# Patient Record
Sex: Female | Born: 1985 | Hispanic: Refuse to answer | Marital: Married | Smoking: Never smoker
Health system: Southern US, Community
[De-identification: ages and names within clinical notes are randomized; demographics above are authoritative.]

## PROBLEM LIST (undated history)

## (undated) HISTORY — PX: TONSILECTOMY, ADENOIDECTOMY, BILATERAL MYRINGOTOMY AND TUBES: SHX2538

---

## 2020-04-15 ENCOUNTER — Ambulatory Visit (INDEPENDENT_AMBULATORY_CARE_PROVIDER_SITE_OTHER): Payer: BC Managed Care – PPO | Admitting: Adult Health

## 2020-04-15 ENCOUNTER — Other Ambulatory Visit: Payer: Self-pay

## 2020-04-15 ENCOUNTER — Encounter: Payer: Self-pay | Admitting: Adult Health

## 2020-04-15 VITALS — BP 119/88 | HR 73 | Temp 98.1°F | Resp 16 | Ht 62.5 in | Wt 147.2 lb

## 2020-04-15 DIAGNOSIS — Z0000 Encounter for general adult medical examination without abnormal findings: Secondary | ICD-10-CM | POA: Diagnosis not present

## 2020-04-15 DIAGNOSIS — Z6826 Body mass index (BMI) 26.0-26.9, adult: Secondary | ICD-10-CM | POA: Insufficient documentation

## 2020-04-15 DIAGNOSIS — R82998 Other abnormal findings in urine: Secondary | ICD-10-CM

## 2020-04-15 DIAGNOSIS — E785 Hyperlipidemia, unspecified: Secondary | ICD-10-CM | POA: Insufficient documentation

## 2020-04-15 DIAGNOSIS — Z1389 Encounter for screening for other disorder: Secondary | ICD-10-CM

## 2020-04-15 DIAGNOSIS — E789 Disorder of lipoprotein metabolism, unspecified: Secondary | ICD-10-CM | POA: Insufficient documentation

## 2020-04-15 DIAGNOSIS — R7989 Other specified abnormal findings of blood chemistry: Secondary | ICD-10-CM | POA: Insufficient documentation

## 2020-04-15 LAB — POCT URINALYSIS DIPSTICK
Bilirubin, UA: NEGATIVE
Glucose, UA: NEGATIVE
Ketones, UA: NEGATIVE
Nitrite, UA: NEGATIVE
Protein, UA: NEGATIVE
Spec Grav, UA: 1.01 (ref 1.010–1.025)
Urobilinogen, UA: 0.2 E.U./dL
pH, UA: 7 (ref 5.0–8.0)

## 2020-04-15 NOTE — Progress Notes (Signed)
New patient visit   Patient: Jo Gibson   DOB: 1985-11-07   35 y.o. Female  MRN: [id number] Visit Date: 04/15/2020  Today's healthcare provider: Jairo Ben, FNP   Chief Complaint  Patient presents with  . New Patient (Initial Visit)   Subjective    Jo Gibson is a 35 y.o. female who presents today as a new patient to establish care.  HPI  Patient reports that she feels way today and has no concerns to address. Patient states that she follows a well balanced diet and states that she is actively exercising 7x a week by walking. Patient reports sleep patterns are well.   She worked hard to get off 15 lbs and lost it with diet and exercise.  She wants to do blood work today.   She had borderline high cholesterol.   Patient  denies any fever, body aches,chills, rash, chest pain, shortness of breath, nausea, vomiting, or diarrhea.  Denies dizziness, lightheadedness, pre syncopal or syncopal episodes.    Mom did genetic testing and was negative for Braca.   She has last PAP was when she had her daughter 2 years ago in June, normal PAP, no abnormal PAP's since her 35 year old was born.  No history of gynecology procedures.   [id number].  Vaginal deliveries.   Patient  denies any fever, body aches,chills, rash, chest pain, shortness of breath, nausea, vomiting, or diarrhea.  Denies dizziness, lightheadedness, pre syncopal or syncopal episodes.    Patient's last menstrual period was 04/15/2020 (exact date).  History reviewed. No pertinent past medical history. History reviewed. No pertinent surgical history. Family Status  Relation Name Status  . Mother  (Not Specified)  . Father  (Not Specified)  . Mat Aunt  (Not Specified)  . MGM  (Not Specified)  . PGM  (Not Specified)  . PGF  (Not Specified)   Family History  Problem Relation Age of Onset  . Cancer Mother   . Epilepsy Mother   . Hypertension Father   . Thyroid disease Maternal Aunt   .  Hypertension Maternal Grandmother   . Liver cancer Paternal Grandmother   . Diabetes Paternal Grandfather        type 2   Social History   Socioeconomic History  . Marital status: Married    Spouse name: Not on file  . Number of children: Not on file  . Years of education: Not on file  . Highest education level: Not on file  Occupational History  . Not on file  Tobacco Use  . Smoking status: Never Smoker  . Smokeless tobacco: Never Used  Substance and Sexual Activity  . Alcohol use: Not Currently  . Drug use: Never  . Sexual activity: Yes  Other Topics Concern  . Not on file  Social History Narrative  . Not on file   Social Determinants of Health   Financial Resource Strain: Not on file  Food Insecurity: Not on file  Transportation Needs: Not on file  Physical Activity: Not on file  Stress: Not on file  Social Connections: Not on file   Outpatient Medications Prior to Visit  Medication Sig  . ASHWAGANDHA PO Take by mouth.   No facility-administered medications prior to visit.   No Known Allergies   Family History  Problem Relation Age of Onset  . Cancer Mother   . Epilepsy Mother   . Hypertension Father   . Thyroid disease Maternal Aunt   . Hypertension Maternal Grandmother   .  Liver cancer Paternal Grandmother   . Diabetes Paternal Grandfather        type 2    There is no immunization history on file for this patient.  Health Maintenance  Topic Date Due  . COVID-19 Vaccine (1) 05/01/2020 (Originally 03/12/1990)  . INFLUENZA VACCINE  05/02/2020 (Originally 09/03/2019)  . TETANUS/TDAP  04/15/2021 (Originally 03/12/2004)  . Hepatitis C Screening  04/15/2021 (Originally Oct 06, 1985)  . HIV Screening  04/15/2021 (Originally 03/12/2000)  . PAP SMEAR-Modifier  05/24/2021  . HPV VACCINES  Aged Out    Patient Care Team: Saesha Llerenas, Eula Fried, FNP as PCP - General (Family Medicine)  Review of Systems  Musculoskeletal: Positive for back pain.  All other systems  reviewed and are negative.     Objective    BP 119/88   Pulse 73   Temp 98.1 F (36.7 C) (Oral)   Resp 16   Ht 5' 2.5" (1.588 m)   Wt 147 lb 3.2 oz (66.8 kg)   LMP 04/15/2020 (Exact Date)   SpO2 100%   BMI 26.49 kg/m  Physical Exam Vitals reviewed.  Constitutional:      General: She is not in acute distress.    Appearance: Normal appearance. She is well-developed and normal weight. She is not ill-appearing, toxic-appearing or diaphoretic.     Interventions: She is not intubated.    Comments: Patient is alert and oriented and responsive to questions Engages in eye contact with provider. Speaks in full sentences without any pauses without any shortness of breath or distress.    HENT:     Head: Normocephalic and atraumatic.     Right Ear: Tympanic membrane, ear canal and external ear normal. There is no impacted cerumen.     Left Ear: Tympanic membrane, ear canal and external ear normal. There is no impacted cerumen.     Nose: Nose normal. No congestion or rhinorrhea.     Mouth/Throat:     Pharynx: No oropharyngeal exudate or posterior oropharyngeal erythema.  Eyes:     General: Lids are normal. No scleral icterus.       Right eye: No discharge.        Left eye: No discharge.     Conjunctiva/sclera: Conjunctivae normal.     Right eye: Right conjunctiva is not injected. No exudate or hemorrhage.    Left eye: Left conjunctiva is not injected. No exudate or hemorrhage.    Pupils: Pupils are equal, round, and reactive to light.  Neck:     Thyroid: No thyroid mass or thyromegaly.     Vascular: Normal carotid pulses. No carotid bruit, hepatojugular reflux or JVD.     Trachea: Trachea and phonation normal. No tracheal tenderness or tracheal deviation.     Meningeal: Brudzinski's sign and Kernig's sign absent.  Cardiovascular:     Rate and Rhythm: Normal rate and regular rhythm.     Pulses: Normal pulses.          Radial pulses are 2+ on the right side and 2+ on the left side.        Dorsalis pedis pulses are 2+ on the right side and 2+ on the left side.       Posterior tibial pulses are 2+ on the right side and 2+ on the left side.     Heart sounds: Normal heart sounds, S1 normal and S2 normal. Heart sounds not distant. No murmur heard. No friction rub. No gallop.   Pulmonary:     Effort: Pulmonary effort  is normal. No tachypnea, bradypnea, accessory muscle usage or respiratory distress. She is not intubated.     Breath sounds: Normal breath sounds. No stridor. No wheezing, rhonchi or rales.  Chest:     Chest wall: No tenderness.  Breasts:     Right: No supraclavicular adenopathy.     Left: No supraclavicular adenopathy.    Abdominal:     General: Bowel sounds are normal. There is no distension or abdominal bruit.     Palpations: Abdomen is soft. There is no shifting dullness, fluid wave, hepatomegaly, splenomegaly, mass or pulsatile mass.     Tenderness: There is no abdominal tenderness. There is no right CVA tenderness, left CVA tenderness, guarding or rebound.     Hernia: No hernia is present.  Musculoskeletal:        General: No tenderness or deformity. Normal range of motion.     Cervical back: Full passive range of motion without pain, normal range of motion and neck supple. No edema, erythema, rigidity or tenderness. No spinous process tenderness or muscular tenderness. Normal range of motion.     Right lower leg: No edema.     Left lower leg: No edema.  Lymphadenopathy:     Head:     Right side of head: No submental, submandibular, tonsillar, preauricular, posterior auricular or occipital adenopathy.     Left side of head: No submental, submandibular, tonsillar, preauricular, posterior auricular or occipital adenopathy.     Cervical: No cervical adenopathy.     Right cervical: No superficial, deep or posterior cervical adenopathy.    Left cervical: No superficial, deep or posterior cervical adenopathy.     Upper Body:     Right upper body: No  supraclavicular or pectoral adenopathy.     Left upper body: No supraclavicular or pectoral adenopathy.  Skin:    General: Skin is warm and dry.     Coloration: Skin is not jaundiced or pale.     Findings: No abrasion, bruising, burn, ecchymosis, erythema, lesion, petechiae or rash.     Nails: There is no clubbing.  Neurological:     Mental Status: She is alert and oriented to person, place, and time. Mental status is at baseline.     GCS: GCS eye subscore is 4. GCS verbal subscore is 5. GCS motor subscore is 6.     Cranial Nerves: No cranial nerve deficit.     Sensory: No sensory deficit.     Motor: No weakness, tremor, atrophy, abnormal muscle tone or seizure activity.     Coordination: Coordination normal.     Gait: Gait normal.     Deep Tendon Reflexes: Reflexes are normal and symmetric. Reflexes normal. Babinski sign absent on the right side. Babinski sign absent on the left side.     Reflex Scores:      Tricep reflexes are 2+ on the right side and 2+ on the left side.      Bicep reflexes are 2+ on the right side and 2+ on the left side.      Brachioradialis reflexes are 2+ on the right side and 2+ on the left side.      Patellar reflexes are 2+ on the right side and 2+ on the left side.      Achilles reflexes are 2+ on the right side and 2+ on the left side. Psychiatric:        Mood and Affect: Mood normal.        Speech: Speech normal.  Behavior: Behavior normal.        Thought Content: Thought content normal.        Judgment: Judgment normal.     Depression Screen PHQ 2/9 Scores 04/15/2020  PHQ - 2 Score 0  PHQ- 9 Score 0   Results for orders placed or performed in visit on 04/15/20  POCT urinalysis dipstick  Result Value Ref Range   Color, UA dark yellow    Clarity, UA clear    Glucose, UA Negative Negative   Bilirubin, UA negative    Ketones, UA negative    Spec Grav, UA 1.010 1.010 - 1.025   Blood, UA non hemolyzed moderate    pH, UA 7.0 5.0 - 8.0    Protein, UA Negative Negative   Urobilinogen, UA 0.2 0.2 or 1.0 E.U./dL   Nitrite, UA negative    Leukocytes, UA Small (1+) (A) Negative   Appearance     Odor      Assessment & Plan       1. Routine health maintenance Screening labs.  The patient is advised to follow a low fat, low cholesterol diet, reduce salt in diet and cooking, reduce exposure to stress, continue current medications, continue current healthy lifestyle patterns and return for routine annual checkups.  - CBC with Differential/Platelet - Comprehensive metabolic panel  2. Screening for blood or protein in urine  - POCT urinalysis dipstick  3. Low serum vitamin D History of taking vitamin D supplement. Will check levels.  - VITAMIN D 25 Hydroxy (Vit-D Deficiency, Fractures)  4. Leukocytes in urine Will send for :  - Urine Culture   5. Borderline high cholesterol Will check.  - Lipid panel - TSH  7. Elevated cortisol level Will check.  - Cortisol  8. Body mass index 26.0-26.9, adult Discussed BMI.   Return for PAP smear when due.  Red Flags discussed. The patient was given clear instructions to go to ER or return to medical center if any red flags develop, symptoms do not improve, worsen or new problems develop. They verbalized understanding.   Return if symptoms worsen or fail to improve, for at any time for any worsening symptoms, Go to Emergency room/ urgent care if worse.    The entirety of the information documented in the History of Present Illness, Review of Systems and Physical Exam were personally obtained by me. Portions of this information were initially documented by the CMA and reviewed by me for thoroughness and accuracy.    Jairo BenMichelle Smith Japheth Diekman, FNP  [facility] [phone] (phone) [phone] (fax)  [facility]

## 2020-04-15 NOTE — Patient Instructions (Signed)
Health Maintenance, Female Adopting a healthy lifestyle and getting preventive care are important in promoting health and wellness. Ask your health care provider about:  The right schedule for you to have regular tests and exams.  Things you can do on your own to prevent diseases and keep yourself healthy. What should I know about diet, weight, and exercise? Eat a healthy diet  Eat a diet that includes plenty of vegetables, fruits, low-fat dairy products, and lean protein.  Do not eat a lot of foods that are high in solid fats, added sugars, or sodium.   Maintain a healthy weight Body mass index (BMI) is used to identify weight problems. It estimates body fat based on height and weight. Your health care provider can help determine your BMI and help you achieve or maintain a healthy weight. Get regular exercise Get regular exercise. This is one of the most important things you can do for your health. Most adults should:  Exercise for at least 150 minutes each week. The exercise should increase your heart rate and make you sweat (moderate-intensity exercise).  Do strengthening exercises at least twice a week. This is in addition to the moderate-intensity exercise.  Spend less time sitting. Even light physical activity can be beneficial. Watch cholesterol and blood lipids Have your blood tested for lipids and cholesterol at 35 years of age, then have this test every 5 years. Have your cholesterol levels checked more often if:  Your lipid or cholesterol levels are high.  You are older than 35 years of age.  You are at high risk for heart disease. What should I know about cancer screening? Depending on your health history and family history, you may need to have cancer screening at various ages. This may include screening for:  Breast cancer.  Cervical cancer.  Colorectal cancer.  Skin cancer.  Lung cancer. What should I know about heart disease, diabetes, and high blood  pressure? Blood pressure and heart disease  High blood pressure causes heart disease and increases the risk of stroke. This is more likely to develop in people who have high blood pressure readings, are of African descent, or are overweight.  Have your blood pressure checked: ? Every 3-5 years if you are 18-39 years of age. ? Every year if you are 40 years old or older. Diabetes Have regular diabetes screenings. This checks your fasting blood sugar level. Have the screening done:  Once every three years after age 40 if you are at a normal weight and have a low risk for diabetes.  More often and at a younger age if you are overweight or have a high risk for diabetes. What should I know about preventing infection? Hepatitis B If you have a higher risk for hepatitis B, you should be screened for this virus. Talk with your health care provider to find out if you are at risk for hepatitis B infection. Hepatitis C Testing is recommended for:  Everyone born from 1945 through 1965.  Anyone with known risk factors for hepatitis C. Sexually transmitted infections (STIs)  Get screened for STIs, including gonorrhea and chlamydia, if: ? You are sexually active and are younger than 35 years of age. ? You are older than 35 years of age and your health care provider tells you that you are at risk for this type of infection. ? Your sexual activity has changed since you were last screened, and you are at increased risk for chlamydia or gonorrhea. Ask your health care provider   if you are at risk.  Ask your health care provider about whether you are at high risk for HIV. Your health care provider may recommend a prescription medicine to help prevent HIV infection. If you choose to take medicine to prevent HIV, you should first get tested for HIV. You should then be tested every 3 months for as long as you are taking the medicine. Pregnancy  If you are about to stop having your period (premenopausal) and  you may become pregnant, seek counseling before you get pregnant.  Take 400 to 800 micrograms (mcg) of folic acid every day if you become pregnant.  Ask for birth control (contraception) if you want to prevent pregnancy. Osteoporosis and menopause Osteoporosis is a disease in which the bones lose minerals and strength with aging. This can result in bone fractures. If you are 65 years old or older, or if you are at risk for osteoporosis and fractures, ask your health care provider if you should:  Be screened for bone loss.  Take a calcium or vitamin D supplement to lower your risk of fractures.  Be given hormone replacement therapy (HRT) to treat symptoms of menopause. Follow these instructions at home: Lifestyle  Do not use any products that contain nicotine or tobacco, such as cigarettes, e-cigarettes, and chewing tobacco. If you need help quitting, ask your health care provider.  Do not use street drugs.  Do not share needles.  Ask your health care provider for help if you need support or information about quitting drugs. Alcohol use  Do not drink alcohol if: ? Your health care provider tells you not to drink. ? You are pregnant, may be pregnant, or are planning to become pregnant.  If you drink alcohol: ? Limit how much you use to 0-1 drink a day. ? Limit intake if you are breastfeeding.  Be aware of how much alcohol is in your drink. In the [place] one drink equals one 12 oz bottle of beer (355 mL), one 5 oz glass of wine (148 mL), or one 1 oz glass of hard liquor (44 mL). General instructions  Schedule regular health, dental, and eye exams.  Stay current with your vaccines.  Tell your health care provider if: ? You often feel depressed. ? You have ever been abused or do not feel safe at home. Summary  Adopting a healthy lifestyle and getting preventive care are important in promoting health and wellness.  Follow your health care provider's instructions about healthy  diet, exercising, and getting tested or screened for diseases.  Follow your health care provider's instructions on monitoring your cholesterol and blood pressure. This information is not intended to replace advice given to you by your health care provider. Make sure you discuss any questions you have with your health care provider. Document Revised: 01/12/2018 Document Reviewed: 01/12/2018 Elsevier Patient Education  2021 [employer]  

## 2020-04-16 ENCOUNTER — Encounter: Payer: Self-pay | Admitting: Adult Health

## 2020-04-16 LAB — COMPREHENSIVE METABOLIC PANEL
ALT: 6 IU/L (ref 0–32)
AST: 12 IU/L (ref 0–40)
Albumin/Globulin Ratio: 1.9 (ref 1.2–2.2)
Albumin: 4.4 g/dL (ref 3.8–4.8)
Alkaline Phosphatase: 46 IU/L (ref 44–121)
BUN/Creatinine Ratio: 17 (ref 9–23)
BUN: 12 mg/dL (ref 6–20)
Bilirubin Total: 0.6 mg/dL (ref 0.0–1.2)
CO2: 24 mmol/L (ref 20–29)
Calcium: 9.5 mg/dL (ref 8.7–10.2)
Chloride: 101 mmol/L (ref 96–106)
Creatinine, Ser: 0.69 mg/dL (ref 0.57–1.00)
Globulin, Total: 2.3 g/dL (ref 1.5–4.5)
Glucose: 88 mg/dL (ref 65–99)
Potassium: 4.5 mmol/L (ref 3.5–5.2)
Sodium: 140 mmol/L (ref 134–144)
Total Protein: 6.7 g/dL (ref 6.0–8.5)
eGFR: 116 mL/min/{1.73_m2} (ref >59)

## 2020-04-16 LAB — CBC WITH DIFFERENTIAL/PLATELET
Basophils Absolute: 0 10*3/uL (ref 0.0–0.2)
Basos: 1 %
EOS (ABSOLUTE): 0.2 10*3/uL (ref 0.0–0.4)
Eos: 2 %
Hematocrit: 39.4 % (ref 34.0–46.6)
Hemoglobin: 13 g/dL (ref 11.1–15.9)
Immature Grans (Abs): 0 10*3/uL (ref 0.0–0.1)
Immature Granulocytes: 0 %
Lymphocytes Absolute: 2.1 10*3/uL (ref 0.7–3.1)
Lymphs: 32 %
MCH: 28.1 pg (ref 26.6–33.0)
MCHC: 33 g/dL (ref 31.5–35.7)
MCV: 85 fL (ref 79–97)
Monocytes Absolute: 0.5 10*3/uL (ref 0.1–0.9)
Monocytes: 7 %
Neutrophils Absolute: 3.9 10*3/uL (ref 1.4–7.0)
Neutrophils: 58 %
Platelets: 277 10*3/uL (ref 150–450)
RBC: 4.63 x10E6/uL (ref 3.77–5.28)
RDW: 13.2 % (ref 11.7–15.4)
WBC: 6.7 10*3/uL (ref 3.4–10.8)

## 2020-04-16 LAB — LIPID PANEL
Chol/HDL Ratio: 4.6 ratio — ABNORMAL HIGH (ref 0.0–4.4)
Cholesterol, Total: 193 mg/dL (ref 100–199)
HDL: 42 mg/dL (ref >39)
LDL Chol Calc (NIH): 134 mg/dL — ABNORMAL HIGH (ref 0–99)
Triglycerides: 91 mg/dL (ref 0–149)
VLDL Cholesterol Cal: 17 mg/dL (ref 5–40)

## 2020-04-16 LAB — TSH: TSH: 0.713 u[IU]/mL (ref 0.450–4.500)

## 2020-04-16 LAB — CORTISOL: Cortisol: 8.3 ug/dL

## 2020-04-16 LAB — VITAMIN D 25 HYDROXY (VIT D DEFICIENCY, FRACTURES): Vit D, 25-Hydroxy: 36.3 ng/mL (ref 30.0–100.0)

## 2020-04-16 NOTE — Progress Notes (Signed)
CBC is within normal limits no signs of infection or  anemia.  CMP is within normal limits kidney/ liver function and electrolytes.   LDL ( bad cholesterol) elevated.  Discuss lifestyle modification with patient e.g. increase exercise, fiber, fruits, vegetables, lean meat, and omega 3/fish intake and decrease saturated fat.  If patient following strict diet and exercise program already please schedule follow up appointment with primary care physician.   Were you fasting for 8 hours before cholesterol check if not this can affect lab results ? Cholesterol total is improved from last year and LDL is around the same.   Vitamin D is within normal limits would advise taking Vitamin D at 4,000 international units by mouth daily - recheck in  vitamin D lab in 4- 6 months as well as lipid panel.   TSH for thyroid is within normal limits. Cortisol level is within normal limits.

## 2020-04-17 LAB — URINE CULTURE: Organism ID, Bacteria: NO GROWTH

## 2020-04-17 NOTE — Progress Notes (Signed)
No growth on urine culture.

## 2020-05-06 ENCOUNTER — Ambulatory Visit: Payer: Self-pay | Admitting: Adult Health

## 2020-05-07 ENCOUNTER — Ambulatory Visit: Payer: Self-pay | Admitting: Adult Health

## 2020-05-09 ENCOUNTER — Ambulatory Visit: Payer: Self-pay | Admitting: Adult Health

## 2020-05-28 ENCOUNTER — Ambulatory Visit (INDEPENDENT_AMBULATORY_CARE_PROVIDER_SITE_OTHER): Payer: BLUE CROSS/BLUE SHIELD | Admitting: Adult Health

## 2020-05-28 ENCOUNTER — Encounter: Payer: Self-pay | Admitting: Adult Health

## 2020-05-28 ENCOUNTER — Other Ambulatory Visit (HOSPITAL_COMMUNITY)
Admission: RE | Admit: 2020-05-28 | Discharge: 2020-05-28 | Disposition: A | Discharge status: Home / Self Care | Payer: BLUE CROSS/BLUE SHIELD | Source: Ambulatory Visit | Attending: Adult Health | Admitting: Adult Health

## 2020-05-28 ENCOUNTER — Other Ambulatory Visit: Payer: Self-pay

## 2020-05-28 VITALS — BP 106/78 | HR 65 | Temp 97.6°F | Ht 62.52 in | Wt 142.6 lb

## 2020-05-28 DIAGNOSIS — E663 Overweight: Secondary | ICD-10-CM

## 2020-05-28 DIAGNOSIS — Z01419 Encounter for gynecological examination (general) (routine) without abnormal findings: Secondary | ICD-10-CM

## 2020-05-28 DIAGNOSIS — E785 Hyperlipidemia, unspecified: Secondary | ICD-10-CM

## 2020-05-28 NOTE — Patient Instructions (Addendum)
Pap Test Why am I having this test? A Pap test, also called a Pap smear, is a screening test to check for signs of:  Cancer of the vagina, cervix, and uterus. The cervix is the lower part of the uterus that opens into the vagina.  Infection.  Changes that may be a sign that cancer is developing (precancerous changes). Women need this test on a regular basis. In general, you should have a Pap test every 3 years until you reach menopause or age 35. Women aged 30-60 may choose to have their Pap test done at the same time as an HPV (human papillomavirus) test every 5 years (instead of every 3 years). Your health care provider may recommend having Pap tests more or less often depending on your medical conditions and past Pap test results. What kind of sample is taken? Your health care provider will collect a sample of cells from the surface of your cervix. This will be done using a small cotton swab, plastic spatula, or brush. This sample is often collected during a pelvic exam, when you are lying on your back on an exam table with feet in footrests (stirrups). In some cases, fluids (secretions) from the cervix or vagina may also be collected.   How do I prepare for this test?  Be aware of where you are in your menstrual cycle. If you are menstruating on the day of the test, you may be asked to reschedule.  You may need to reschedule if you have a known vaginal infection on the day of the test.  Follow instructions from your health care provider about: ? Changing or stopping your regular medicines. Some medicines can cause abnormal test results, such as digitalis and tetracycline. ? Avoiding douching or taking a bath the day before or the day of the test. Tell a health care provider about:  Any allergies you have.  All medicines you are taking, including vitamins, herbs, eye drops, creams, and over-the-counter medicines.  Any blood disorders you have.  Any surgeries you have had.  Any  medical conditions you have.  Whether you are pregnant or may be pregnant. How are the results reported? Your test results will be reported as either abnormal or normal. A false-positive result can occur. A false positive is incorrect because it means that a condition is present when it is not. A false-negative result can occur. A false negative is incorrect because it means that a condition is not present when it is. What do the results mean? A normal test result means that you do not have signs of cancer of the vagina, cervix, or uterus. An abnormal result may mean that you have:  Cancer. A Pap test by itself is not enough to diagnose cancer. You will have more tests done in this case.  Precancerous changes in your vagina, cervix, or uterus.  Inflammation of the cervix.  An STD (sexually transmitted disease).  A fungal infection.  A parasite infection. Talk with your health care provider about what your results mean. Questions to ask your health care provider Ask your health care provider, or the department that is doing the test:  When will my results be ready?  How will I get my results?  What are my treatment options?  What other tests do I need?  What are my next steps? Summary  In general, women should have a Pap test every 3 years until they reach menopause or age 29.  Your health care provider will collect  collect a sample of cells from the surface of your cervix. This will be done using a small cotton swab, plastic spatula, or brush.  In some cases, fluids (secretions) from the cervix or vagina may also be collected. This information is not intended to replace advice given to you by your health care provider. Make sure you discuss any questions you have with your health care provider. Document Revised: 09/27/2019 Document Reviewed: 09/22/2019 Elsevier Patient Education  2021 Elsevier Inc.  Breast Self-Awareness Breast self-awareness is knowing how your breasts look  and feel. Doing breast self-awareness is important. It allows you to catch a breast problem early while it is still small and can be treated. All women should do breast self-awareness, including women who have had breast implants. Tell your doctor if you notice a change in your breasts. What you need:  A mirror.  A well-lit room. How to do a breast self-exam A breast self-exam is one way to learn what is normal for your breasts and to check for changes. To do a breast self-exam: Look for changes 1. Take off all the clothes above your waist. 2. Stand in front of a mirror in a room with good lighting. 3. Put your hands on your hips. 4. Push your hands down. 5. Look at your breasts and nipples in the mirror to see if one breast or nipple looks different from the other. Check to see if: ? The shape of one breast is different. ? The size of one breast is different. ? There are wrinkles, dips, and bumps in one breast and not the other. 6. Look at each breast for changes in the skin, such as: ? Redness. ? Scaly areas. 7. Look for changes in your nipples, such as: ? Liquid around the nipples. ? Bleeding. ? Dimpling. ? Redness. ? A change in where the nipples are.   Feel for changes 1. Lie on your back on the floor. 2. Feel each breast. To do this, follow these steps: ? Pick a breast to feel. ? Put the arm closest to that breast above your head. ? Use your other arm to feel the nipple area of your breast. Feel the area with the pads of your three middle fingers by making small circles with your fingers. For the first circle, press lightly. For the second circle, press harder. For the third circle, press even harder. ? Keep making circles with your fingers at the different pressures as you move down your breast. Stop when you feel your ribs. ? Move your fingers a little toward the center of your body. ? Start making circles with your fingers again, this time going up until you reach your  collarbone. ? Keep making up-and-down circles until you reach your armpit. Remember to keep using the three pressures. ? Feel the other breast in the same way. 3. Sit or stand in the tub or shower. 4. With soapy water on your skin, feel each breast the same way you did in step 2 when you were lying on the floor.   Write down what you find Writing down what you find can help you remember what to tell your doctor. Write down:  What is normal for each breast.  Any changes you find in each breast, including: ? The kind of changes you find. ? Whether you have pain. ? Size and location of any lumps.  When you last had your menstrual period. General tips  Check your breasts every month.  If you   are breastfeeding, the best time to check your breasts is after you feed your baby or after you use a breast pump.  If you get menstrual periods, the best time to check your breasts is 5-7 days after your menstrual period is over.  With time, you will become comfortable with the self-exam, and you will begin to know if there are changes in your breasts. Contact a doctor if you:  See a change in the shape or size of your breasts or nipples.  See a change in the skin of your breast or nipples, such as red or scaly skin.  Have fluid coming from your nipples that is not normal.  Find a lump or thick area that was not there before.  Have pain in your breasts.  Have any concerns about your breast health. Summary  Breast self-awareness includes looking for changes in your breasts, as well as feeling for changes within your breasts.  Breast self-awareness should be done in front of a mirror in a well-lit room.  You should check your breasts every month. If you get menstrual periods, the best time to check your breasts is 5-7 days after your menstrual period is over.  Let your doctor know of any changes you see in your breasts, including changes in size, changes on the skin, pain or tenderness,  or fluid from your nipples that is not normal. This information is not intended to replace advice given to you by your health care provider. Make sure you discuss any questions you have with your health care provider. Document Revised: 09/07/2017 Document Reviewed: 09/07/2017 Elsevier Patient Education  2021 Elsevier Inc.  

## 2020-05-28 NOTE — Progress Notes (Signed)
Established Patient Office Visit  Subjective:  Patient ID: Jo Gibson, female    DOB: 1985/12/02  Age: 35 y.o. MRN: [id number]  CC:  Chief Complaint  Patient presents with  . Follow-up    Pap smear and breast exam    HPI Jo Gibson presents for pap smear and follow up on hyperlipidemia. She has no concerns today. Her mother did have breast cancer in her 74's and she did test negative for BRACA gene.    Cholesterol was elevated however patient was not fasting. She will have this rechecked fasting.   Patient  denies any fever, body aches,chills, rash, chest pain, shortness of breath, nausea, vomiting, or diarrhea.  Denies dizziness, lightheadedness, pre syncopal or syncopal episodes.     History reviewed. No pertinent past medical history.  History reviewed. No pertinent surgical history.  Family History  Problem Relation Age of Onset  . Cancer Mother   . Epilepsy Mother   . Hypertension Father   . Thyroid disease Maternal Aunt   . Hypertension Maternal Grandmother   . Liver cancer Paternal Grandmother   . Diabetes Paternal Grandfather        type 2    Social History   Socioeconomic History  . Marital status: Married    Spouse name: Not on file  . Number of children: Not on file  . Years of education: Not on file  . Highest education level: Not on file  Occupational History  . Not on file  Tobacco Use  . Smoking status: Never Smoker  . Smokeless tobacco: Never Used  Substance and Sexual Activity  . Alcohol use: Not Currently  . Drug use: Never  . Sexual activity: Yes  Other Topics Concern  . Not on file  Social History Narrative  . Not on file   Social Determinants of Health   Financial Resource Strain: Not on file  Food Insecurity: Not on file  Transportation Needs: Not on file  Physical Activity: Not on file  Stress: Not on file  Social Connections: Not on file  Intimate Partner Violence: Not on file    Outpatient Medications Prior to  Visit  Medication Sig Dispense Refill  . ASHWAGANDHA PO Take by mouth.     No facility-administered medications prior to visit.    No Known Allergies  ROS Review of Systems  Constitutional: Negative.   Respiratory: Negative.   Cardiovascular: Negative.   Genitourinary: Negative.       Objective:    Physical Exam Exam conducted with a chaperone present.  Constitutional:      General: She is not in acute distress.    Appearance: Normal appearance. She is normal weight. She is not ill-appearing, toxic-appearing or diaphoretic.  HENT:     Head: Normocephalic and atraumatic.     Right Ear: External ear normal.     Left Ear: External ear normal.     Nose: Nose normal.     Mouth/Throat:     Mouth: Mucous membranes are moist.  Eyes:     Conjunctiva/sclera: Conjunctivae normal.  Cardiovascular:     Rate and Rhythm: Normal rate and regular rhythm.     Pulses: Normal pulses.     Heart sounds: Normal heart sounds. No murmur heard. No friction rub. No gallop.   Pulmonary:     Effort: Pulmonary effort is normal. No respiratory distress.     Breath sounds: Normal breath sounds. No stridor. No wheezing, rhonchi or rales.  Chest:     Chest  wall: No tenderness.  Breasts:     Tanner Score is 5. Breasts are symmetrical.     Right: Normal. No axillary adenopathy or supraclavicular adenopathy.     Left: Normal. No axillary adenopathy or supraclavicular adenopathy.    Abdominal:     General: There is no distension.     Palpations: Abdomen is soft.     Tenderness: There is no abdominal tenderness.     Hernia: There is no hernia in the left inguinal area or right inguinal area.  Genitourinary:    General: Normal vulva.     Pubic Area: No rash or pubic lice.      Tanner stage (genital): 5.     Labia:        Right: No rash, tenderness, lesion or injury.        Left: No rash, tenderness, lesion or injury.      Urethra: No prolapse, urethral pain, urethral swelling or urethral  lesion.     Vagina: Normal.     Cervix: Normal and dilated.     Uterus: Normal.      Adnexa: Right adnexa normal and left adnexa normal.  Musculoskeletal:        General: Normal range of motion.     Cervical back: Normal range of motion and neck supple.  Lymphadenopathy:     Cervical: No cervical adenopathy.     Upper Body:     Right upper body: No supraclavicular, axillary or pectoral adenopathy.     Left upper body: No supraclavicular, axillary or pectoral adenopathy.     Lower Body: No right inguinal adenopathy. No left inguinal adenopathy.  Skin:    General: Skin is warm.     Findings: No erythema or rash.  Neurological:     General: No focal deficit present.     Mental Status: She is alert and oriented to person, place, and time.  Psychiatric:        Mood and Affect: Mood normal.        Behavior: Behavior normal.        Thought Content: Thought content normal.        Judgment: Judgment normal.     BP 106/78 (BP Location: Left Arm, Patient Position: Sitting)   Pulse 65   Temp 97.6 F (36.4 C)   Ht 5' 2.52" (1.588 m)   Wt 142 lb 9.6 oz (64.7 kg)   SpO2 99%   BMI 25.65 kg/m  Wt Readings from Last 3 Encounters:  05/28/20 142 lb 9.6 oz (64.7 kg)  04/15/20 147 lb 3.2 oz (66.8 kg)     Health Maintenance Due  Topic Date Due  . COVID-19 Vaccine (1) Never done    There are no preventive care reminders to display for this patient.  Lab Results  Component Value Date   TSH 0.713 04/15/2020   Lab Results  Component Value Date   WBC 6.7 04/15/2020   HGB 13.0 04/15/2020   HCT 39.4 04/15/2020   MCV 85 04/15/2020   PLT 277 04/15/2020   Lab Results  Component Value Date   NA 140 04/15/2020   K 4.5 04/15/2020   CO2 24 04/15/2020   GLUCOSE 88 04/15/2020   BUN 12 04/15/2020   CREATININE 0.69 04/15/2020   BILITOT 0.6 04/15/2020   ALKPHOS 46 04/15/2020   AST 12 04/15/2020   ALT 6 04/15/2020   PROT 6.7 04/15/2020   ALBUMIN 4.4 04/15/2020   CALCIUM 9.5  04/15/2020   EGFR 116  04/15/2020   Lab Results  Component Value Date   CHOL 193 04/15/2020   Lab Results  Component Value Date   HDL 42 04/15/2020   Lab Results  Component Value Date   LDLCALC 134 (H) 04/15/2020   Lab Results  Component Value Date   TRIG 91 04/15/2020   Lab Results  Component Value Date   CHOLHDL 4.6 (H) 04/15/2020   No results found for: HGBA1C    Assessment & Plan:   Problem List Items Addressed This Visit   None   Visit Diagnoses    Pap smear, as part of routine gynecological examination    -  Primary   Relevant Orders   Cytology - PAP   Overweight with body mass index (BMI) 25.0-29.9       Hyperlipidemia, unspecified hyperlipidemia type       Relevant Orders   Lipid Panel w/o Chol/HDL Ratio     PAP today repeat in 3 years if within normal limits.   Self breast exams advised monthly. Report any change.   Red Flags discussed. The patient was given clear instructions to go to ER or return to medical center if any red flags develop, symptoms do not improve, worsen or new problems develop. They verbalized understanding.  No orders of the defined types were placed in this encounter.   Follow-up: Return if symptoms worsen or fail to improve, for at any time for any worsening symptoms, Go to Emergency room/ urgent care if worse.    Marcille Buffy, FNP

## 2020-05-31 ENCOUNTER — Encounter: Payer: Self-pay | Admitting: Adult Health

## 2020-05-31 LAB — CYTOLOGY - PAP
Comment: NEGATIVE
Diagnosis: NEGATIVE
High risk HPV: NEGATIVE

## 2020-06-03 NOTE — Progress Notes (Signed)
PAP is within normal limits. Repeat in 3 years advised around 05/29/2023 unless clinically indicated sooner.

## 2020-06-04 ENCOUNTER — Encounter: Payer: Self-pay | Admitting: Adult Health

## 2020-06-05 ENCOUNTER — Encounter: Payer: Self-pay | Admitting: Adult Health

## 2020-06-06 DIAGNOSIS — M546 Pain in thoracic spine: Secondary | ICD-10-CM | POA: Diagnosis not present

## 2020-06-06 DIAGNOSIS — M5451 Vertebrogenic low back pain: Secondary | ICD-10-CM | POA: Diagnosis not present

## 2020-06-06 DIAGNOSIS — M9903 Segmental and somatic dysfunction of lumbar region: Secondary | ICD-10-CM | POA: Diagnosis not present

## 2020-06-06 DIAGNOSIS — M9902 Segmental and somatic dysfunction of thoracic region: Secondary | ICD-10-CM | POA: Diagnosis not present

## 2020-08-27 DIAGNOSIS — M9902 Segmental and somatic dysfunction of thoracic region: Secondary | ICD-10-CM | POA: Diagnosis not present

## 2020-08-27 DIAGNOSIS — M546 Pain in thoracic spine: Secondary | ICD-10-CM | POA: Diagnosis not present

## 2020-08-27 DIAGNOSIS — M5451 Vertebrogenic low back pain: Secondary | ICD-10-CM | POA: Diagnosis not present

## 2020-08-27 DIAGNOSIS — M9903 Segmental and somatic dysfunction of lumbar region: Secondary | ICD-10-CM | POA: Diagnosis not present

## 2020-09-08 ENCOUNTER — Encounter: Payer: Self-pay | Admitting: Adult Health

## 2020-09-09 ENCOUNTER — Other Ambulatory Visit: Payer: Self-pay | Admitting: Adult Health

## 2020-09-09 ENCOUNTER — Encounter: Payer: Self-pay | Admitting: Family

## 2020-09-09 ENCOUNTER — Other Ambulatory Visit: Payer: Self-pay | Admitting: Family

## 2020-09-09 DIAGNOSIS — N644 Mastodynia: Secondary | ICD-10-CM

## 2020-09-10 ENCOUNTER — Encounter: Payer: Self-pay | Admitting: Family

## 2020-09-11 ENCOUNTER — Other Ambulatory Visit: Payer: Self-pay

## 2020-09-11 DIAGNOSIS — Z0000 Encounter for general adult medical examination without abnormal findings: Secondary | ICD-10-CM

## 2020-09-12 ENCOUNTER — Ambulatory Visit
Admission: RE | Admit: 2020-09-12 | Discharge: 2020-09-12 | Disposition: A | Discharge status: Home / Self Care | Payer: BLUE CROSS/BLUE SHIELD | Source: Ambulatory Visit | Attending: Family | Admitting: Family

## 2020-09-12 ENCOUNTER — Other Ambulatory Visit: Payer: Self-pay

## 2020-09-12 DIAGNOSIS — N644 Mastodynia: Secondary | ICD-10-CM

## 2020-09-12 DIAGNOSIS — Z803 Family history of malignant neoplasm of breast: Secondary | ICD-10-CM | POA: Diagnosis not present

## 2020-09-12 DIAGNOSIS — R922 Inconclusive mammogram: Secondary | ICD-10-CM | POA: Diagnosis not present

## 2020-09-13 ENCOUNTER — Other Ambulatory Visit: Payer: Self-pay

## 2020-09-13 ENCOUNTER — Other Ambulatory Visit (INDEPENDENT_AMBULATORY_CARE_PROVIDER_SITE_OTHER): Payer: BLUE CROSS/BLUE SHIELD

## 2020-09-13 DIAGNOSIS — E785 Hyperlipidemia, unspecified: Secondary | ICD-10-CM

## 2020-09-14 LAB — LIPID PANEL W/O CHOL/HDL RATIO
Cholesterol, Total: 228 mg/dL — ABNORMAL HIGH (ref 100–199)
HDL: 45 mg/dL (ref >39)
LDL Chol Calc (NIH): 169 mg/dL — ABNORMAL HIGH (ref 0–99)
Triglycerides: 79 mg/dL (ref 0–149)
VLDL Cholesterol Cal: 14 mg/dL (ref 5–40)

## 2020-09-16 ENCOUNTER — Encounter: Payer: Self-pay | Admitting: Adult Health

## 2020-09-16 ENCOUNTER — Encounter: Payer: Self-pay | Admitting: Family

## 2020-09-16 ENCOUNTER — Other Ambulatory Visit: Payer: Self-pay

## 2020-09-16 DIAGNOSIS — Z0000 Encounter for general adult medical examination without abnormal findings: Secondary | ICD-10-CM

## 2020-09-16 NOTE — Telephone Encounter (Signed)
I have spoken with patient & she is scheduled for remaining labs tomorrow. She is aware that at appointment in September that cholesterol med can be discussed.

## 2020-09-17 ENCOUNTER — Other Ambulatory Visit (INDEPENDENT_AMBULATORY_CARE_PROVIDER_SITE_OTHER): Payer: BLUE CROSS/BLUE SHIELD

## 2020-09-17 ENCOUNTER — Other Ambulatory Visit: Payer: Self-pay

## 2020-09-17 DIAGNOSIS — Z0000 Encounter for general adult medical examination without abnormal findings: Secondary | ICD-10-CM | POA: Diagnosis not present

## 2020-09-17 LAB — CBC WITH DIFFERENTIAL/PLATELET
Basophils Absolute: 0 10*3/uL (ref 0.0–0.1)
Basophils Relative: 0.5 % (ref 0.0–3.0)
Eosinophils Absolute: 0.2 10*3/uL (ref 0.0–0.7)
Eosinophils Relative: 2 % (ref 0.0–5.0)
HCT: 36.2 % (ref 36.0–46.0)
Hemoglobin: 12.5 g/dL (ref 12.0–15.0)
Lymphocytes Relative: 31.6 % (ref 12.0–46.0)
Lymphs Abs: 2.6 10*3/uL (ref 0.7–4.0)
MCHC: 34.6 g/dL (ref 30.0–36.0)
MCV: 83.5 fl (ref 78.0–100.0)
Monocytes Absolute: 0.5 10*3/uL (ref 0.1–1.0)
Monocytes Relative: 6.6 % (ref 3.0–12.0)
Neutro Abs: 4.9 10*3/uL (ref 1.4–7.7)
Neutrophils Relative %: 59.3 % (ref 43.0–77.0)
Platelets: 272 10*3/uL (ref 150.0–400.0)
RBC: 4.33 Mil/uL (ref 3.87–5.11)
RDW: 13.3 % (ref 11.5–15.5)
WBC: 8.2 10*3/uL (ref 4.0–10.5)

## 2020-09-17 LAB — COMPREHENSIVE METABOLIC PANEL
ALT: 6 U/L (ref 0–35)
AST: 13 U/L (ref 0–37)
Albumin: 4.3 g/dL (ref 3.5–5.2)
Alkaline Phosphatase: 38 U/L — ABNORMAL LOW (ref 39–117)
BUN: 15 mg/dL (ref 6–23)
CO2: 30 mEq/L (ref 19–32)
Calcium: 9.9 mg/dL (ref 8.4–10.5)
Chloride: 102 mEq/L (ref 96–112)
Creatinine, Ser: 0.66 mg/dL (ref 0.40–1.20)
GFR: 113.57 mL/min (ref >60.00)
Glucose, Bld: 81 mg/dL (ref 70–99)
Potassium: 4.3 mEq/L (ref 3.5–5.1)
Sodium: 139 mEq/L (ref 135–145)
Total Bilirubin: 0.6 mg/dL (ref 0.2–1.2)
Total Protein: 6.5 g/dL (ref 6.0–8.3)

## 2020-09-17 LAB — TSH: TSH: 0.68 u[IU]/mL (ref 0.35–5.50)

## 2020-09-17 LAB — VITAMIN D 25 HYDROXY (VIT D DEFICIENCY, FRACTURES): VITD: 59.86 ng/mL (ref 30.00–100.00)

## 2020-09-18 ENCOUNTER — Encounter: Payer: Self-pay | Admitting: Family

## 2020-09-19 NOTE — Telephone Encounter (Signed)
Paperwork has been received and is inside the [employer] located on my desk. Patient has been called and scheduled for an appointment with Rennie Plowman on 09/23/20 at 1:30pm.

## 2020-09-20 NOTE — Telephone Encounter (Signed)
Patient stated that she does not need the leave paperwork filled. Paperwork has been placed in [employer] red patient folder located on her desk. Pt has an appointment on 09/23/20.

## 2020-09-23 ENCOUNTER — Encounter: Payer: Self-pay | Admitting: Family

## 2020-09-23 ENCOUNTER — Telehealth (INDEPENDENT_AMBULATORY_CARE_PROVIDER_SITE_OTHER): Payer: BLUE CROSS/BLUE SHIELD | Admitting: Family

## 2020-09-23 VITALS — Ht 62.5 in | Wt 136.0 lb

## 2020-09-23 DIAGNOSIS — E785 Hyperlipidemia, unspecified: Secondary | ICD-10-CM | POA: Diagnosis not present

## 2020-09-23 DIAGNOSIS — E789 Disorder of lipoprotein metabolism, unspecified: Secondary | ICD-10-CM

## 2020-09-23 NOTE — Progress Notes (Signed)
Virtual Visit via Video Note  I connected with@  on 09/23/20 at  1:30 PM EDT by a video enabled telemedicine application and verified that I am speaking with the correct person using two identifiers.  Location patient: home Location provider:work  Persons participating in the virtual visit: patient, provider  I discussed the limitations of evaluation and management by telemedicine and the availability of in person appointments. The patient expressed understanding and agreed to proceed.   HPI:  Primarily wants to discuss elevated cholesterol.  This was most bothersome to her. She is exercising and eating healthy diet.  She prefers not to be on medication for cholesterol. No CP.  She has lost 30lbs since having children in the past two years. She doesn't eat fast food or fried foods.  No  family h/o scd, cad.  Father has history of hyperlipidemia   Stress at work the last 2 weeks as her boss was out due to grievances. She feels optimistic as boss has worked out Higher education careers adviser for mental health after they spoke 3 days ago. She is taking more time off.  She denies any further evaluation for this. She denies anxiety and depression.  No si/hi.   ROS: See pertinent positives and negatives per HPI.    EXAM:  VITALS per patient if applicable: Ht 5' 2.5" (1.588 m)   Wt 136 lb (61.7 kg)   BMI 24.48 kg/m  BP Readings from Last 3 Encounters:  05/28/20 106/78  04/15/20 119/88   Wt Readings from Last 3 Encounters:  09/23/20 136 lb (61.7 kg)  05/28/20 142 lb 9.6 oz (64.7 kg)  04/15/20 147 lb 3.2 oz (66.8 kg)    GENERAL: alert, oriented, appears well and in no acute distress  HEENT: atraumatic, conjunttiva clear, no obvious abnormalities on inspection of external nose and ears  NECK: normal movements of the head and neck  LUNGS: on inspection no signs of respiratory distress, breathing rate appears normal, no obvious gross SOB, gasping or wheezing  CV: no obvious cyanosis  MS: moves all  visible extremities without noticeable abnormality  PSYCH/NEURO: pleasant and cooperative, no obvious depression or anxiety, speech and thought processing grossly intact  ASSESSMENT AND PLAN:  Discussed the following assessment and plan:  Problem List Items Addressed This Visit       Other   HLD (hyperlipidemia) - Primary    Family history of hyperlipidemia.  Discussed with patient LDL 169 despite weight loss and lifestyle measures.  Advised her to have further evaluation with cardiology specifically for familial hypercholesterolemia.  Referral has been placed.  Referral to nutrition as well.  Close follow-up       -we discussed possible serious and likely etiologies, options for evaluation and workup, limitations of telemedicine visit vs in person visit, treatment, treatment risks and precautions. Pt prefers to treat via telemedicine empirically rather then risking or undertaking an in person visit at this moment.  .   I discussed the assessment and treatment plan with the patient. The patient was provided an opportunity to ask questions and all were answered. The patient agreed with the plan and demonstrated an understanding of the instructions.   The patient was advised to call back or seek an in-person evaluation if the symptoms worsen or if the condition fails to improve as anticipated.   Rennie Plowman, FNP

## 2020-09-23 NOTE — Assessment & Plan Note (Signed)
Family history of hyperlipidemia.  Discussed with patient LDL 169 despite weight loss and lifestyle measures.  Advised her to have further evaluation with cardiology specifically for familial hypercholesterolemia.  Referral has been placed.  Referral to nutrition as well.  Close follow-up

## 2020-09-23 NOTE — Patient Instructions (Addendum)
Referral to cardiology, nutrition Let us know if you dont hear back within a week in regards to an appointment being scheduled.

## 2020-10-01 DIAGNOSIS — M5451 Vertebrogenic low back pain: Secondary | ICD-10-CM | POA: Diagnosis not present

## 2020-10-01 DIAGNOSIS — M546 Pain in thoracic spine: Secondary | ICD-10-CM | POA: Diagnosis not present

## 2020-10-01 DIAGNOSIS — M9902 Segmental and somatic dysfunction of thoracic region: Secondary | ICD-10-CM | POA: Diagnosis not present

## 2020-10-01 DIAGNOSIS — M9903 Segmental and somatic dysfunction of lumbar region: Secondary | ICD-10-CM | POA: Diagnosis not present

## 2020-10-04 ENCOUNTER — Ambulatory Visit: Payer: BLUE CROSS/BLUE SHIELD | Admitting: Family

## 2020-10-04 ENCOUNTER — Encounter: Payer: Self-pay | Admitting: Cardiology

## 2020-10-04 ENCOUNTER — Other Ambulatory Visit: Payer: Self-pay

## 2020-10-04 ENCOUNTER — Ambulatory Visit (INDEPENDENT_AMBULATORY_CARE_PROVIDER_SITE_OTHER): Payer: BLUE CROSS/BLUE SHIELD | Admitting: Cardiology

## 2020-10-04 VITALS — BP 100/60 | HR 65 | Ht 62.5 in | Wt 134.0 lb

## 2020-10-04 DIAGNOSIS — E785 Hyperlipidemia, unspecified: Secondary | ICD-10-CM

## 2020-10-04 MED ORDER — ATORVASTATIN CALCIUM 40 MG PO TABS: 40.0000 mg | ORAL_TABLET | Freq: Every day | ORAL | 5 refills | Status: DC

## 2020-10-04 NOTE — Patient Instructions (Signed)
Medication Instructions:   Your physician has recommended you make the following change in your medication:    START taking Lipitor 40 MG once a day.  *If you need a refill on your cardiac medications before your next appointment, please call your pharmacy*   Lab Work:  Your physician recommends that you return for a FASTING lipid profile: in 3 months.   - You will need to be fasting. Please do not have anything to eat or drink after midnight the morning you have the lab work. You may only have water or black coffee with no cream or sugar.   OUR OFFICE WILL CALL YOU TO SCHEDULE YOU PRIOR TO YOUR FOLLOW UP APPOINTMENT  Testing/Procedures: None ordered   Follow-Up: At [facility], you and your health needs are our priority.  As part of our continuing mission to provide you with exceptional heart care, we have created designated Provider Care Teams.  These Care Teams include your primary Cardiologist (physician) and Advanced Practice Providers (APPs -  Physician Assistants and Nurse Practitioners) who all work together to provide you with the care you need, when you need it.  We recommend signing up for the patient portal called "MyChart".  Sign up information is provided on this After Visit Summary.  MyChart is used to connect with patients for Virtual Visits (Telemedicine).  Patients are able to view lab/test results, encounter notes, upcoming appointments, etc.  Non-urgent messages can be sent to your provider as well.   To learn more about what you can do with MyChart, go to [email or web address].    Your next appointment:   3 month(s)  The format for your next appointment:   In Person  Provider:   You may see Debbe Odea, MD or one of the following Advanced Practice Providers on your designated Care Team:   Nicolasa Ducking, NP Eula Listen, PA-C Marisue Ivan, PA-C Cadence Fransico Michael, [place]   Other Instructions

## 2020-10-04 NOTE — Progress Notes (Signed)
Cardiology Office Note:    Date:  10/04/2020   ID:  Jo Gibson, DOB [birth year], MRN [id number]  PCP:  Berniece Pap, FNP   [facility] Providers Cardiologist:  Debbe Odea, MD     Referring MD: Allegra Grana, FNP   Chief Complaint  Patient presents with   Other   New Patient (Initial Visit)    Referred by Pcp for Borderline high cholesterol. Meds reviewed verbally with patient.    Jo Gibson is a 35 y.o. female who is being seen today for the evaluation of hyperlipidemia at the request of Jason Coop Lyn Records, FNP.   History of Present Illness:    Jo Gibson is a 35 y.o. female with a hx of hyperlipidemia who presents due to elevated cholesterols.  Patient states her cholesterol has been elevated for some time now.  Recently moved to the area from [place].  States her grandmother and father have history of high cholesterol.  She has tried diet and exercise over the past several months, her cholesterol levels are actually worsening.  She denies chest pain or shortness of breath, denies palpitations.  History reviewed. No pertinent past medical history.  History reviewed. No pertinent surgical history.  Current Medications: Current Meds  Medication Sig   ASHWAGANDHA PO Take by mouth.   atorvastatin (LIPITOR) 40 MG tablet Take 1 tablet (40 mg total) by mouth daily.     Allergies:   Patient has no known allergies.   Social History   Socioeconomic History   Marital status: Married    Spouse name: Not on file   Number of children: Not on file   Years of education: Not on file   Highest education level: Not on file  Occupational History   Not on file  Tobacco Use   Smoking status: Never   Smokeless tobacco: Never  Substance and Sexual Activity   Alcohol use: Not Currently   Drug use: Never   Sexual activity: Yes  Other Topics Concern   Not on file  Social History Narrative   Not on file   Social Determinants of Health    Financial Resource Strain: Not on file  Food Insecurity: Not on file  Transportation Needs: Not on file  Physical Activity: Not on file  Stress: Not on file  Social Connections: Not on file     Family History: The patient's family history includes Cancer in her mother; Diabetes in her paternal grandfather; Epilepsy in her mother; Hyperlipidemia in her father and maternal grandmother; Hypertension in her father and maternal grandmother; Liver cancer in her paternal grandmother; Thyroid disease in her maternal aunt.  ROS:   Please see the history of present illness.     All other systems reviewed and are negative.  EKGs/Labs/Other Studies Reviewed:    The following studies were reviewed today:   EKG:  EKG is  ordered today.  The ekg ordered today demonstrates sinus rhythm, sinus arrhythmia, normal ECG  Recent Labs: 09/17/2020: ALT 6; BUN 15; Creatinine, Ser 0.66; Hemoglobin 12.5; Platelets 272.0; Potassium 4.3; Sodium 139; TSH 0.68  Recent Lipid Panel    Component Value Date/Time   CHOL 228 (H) 09/13/2020 0918   TRIG 79 09/13/2020 0918   HDL 45 09/13/2020 0918   CHOLHDL 4.6 (H) 04/15/2020 0923   LDLCALC 169 (H) 09/13/2020 0918     Risk Assessment/Calculations:          Physical Exam:    VS:  BP 100/60 (BP Location: Left Arm, Patient  Position: Sitting, Cuff Size: Normal)   Pulse 65   Ht 5' 2.5" (1.588 m)   Wt 134 lb (60.8 kg)   BMI 24.12 kg/m     Wt Readings from Last 3 Encounters:  10/04/20 134 lb (60.8 kg)  09/23/20 136 lb (61.7 kg)  05/28/20 142 lb 9.6 oz (64.7 kg)     GEN:  Well nourished, well developed in no acute distress HEENT: Normal NECK: No JVD; No carotid bruits LYMPHATICS: No lymphadenopathy CARDIAC: RRR, no murmurs, rubs, gallops RESPIRATORY:  Clear to auscultation without rales, wheezing or rhonchi  ABDOMEN: Soft, non-tender, non-distended MUSCULOSKELETAL:  No edema; No deformity  SKIN: Warm and dry NEUROLOGIC:  Alert and oriented x  3 PSYCHIATRIC:  Normal affect   ASSESSMENT:    1. Hyperlipidemia, unspecified hyperlipidemia type    PLAN:    In order of problems listed above:  Hyperlipidemia, family history of elevated cholesterol in father and grandmother.  LDL 169, elevated but not in the range of FH.  Discussed at length etiology for possible elevated cholesterol, will schedule patient to get genetic testing for possible familial causes.  Start Lipitor 40 mg daily, repeat fasting lipid profile in about 3 months.  Follow-up in 3 months after repeat fasting lipid profile.      Medication Adjustments/Labs and Tests Ordered: Current medicines are reviewed at length with the patient today.  Concerns regarding medicines are outlined above.  Orders Placed This Encounter  Procedures   Familial Hypercholesterolemia (COHESION)   Lipid panel   Ambulatory referral to Genetics   EKG 12-Lead   Meds ordered this encounter  Medications   atorvastatin (LIPITOR) 40 MG tablet    Sig: Take 1 tablet (40 mg total) by mouth daily.    Dispense:  30 tablet    Refill:  5    Patient Instructions  Medication Instructions:   Your physician has recommended you make the following change in your medication:    START taking Lipitor 40 MG once a day.  *If you need a refill on your cardiac medications before your next appointment, please call your pharmacy*   Lab Work:  Your physician recommends that you return for a FASTING lipid profile: in 3 months.   - You will need to be fasting. Please do not have anything to eat or drink after midnight the morning you have the lab work. You may only have water or black coffee with no cream or sugar.   OUR OFFICE WILL CALL YOU TO SCHEDULE YOU PRIOR TO YOUR FOLLOW UP APPOINTMENT  Testing/Procedures: None ordered   Follow-Up: At [facility], you and your health needs are our priority.  As part of our continuing mission to provide you with exceptional heart care, we have created  designated Provider Care Teams.  These Care Teams include your primary Cardiologist (physician) and Advanced Practice Providers (APPs -  Physician Assistants and Nurse Practitioners) who all work together to provide you with the care you need, when you need it.  We recommend signing up for the patient portal called "MyChart".  Sign up information is provided on this After Visit Summary.  MyChart is used to connect with patients for Virtual Visits (Telemedicine).  Patients are able to view lab/test results, encounter notes, upcoming appointments, etc.  Non-urgent messages can be sent to your provider as well.   To learn more about what you can do with MyChart, go to [email or web address].    Your next appointment:   3 month(s)  The format  for your next appointment:   In Person  Provider:   You may see Debbe Odea, MD or one of the following Advanced Practice Providers on your designated Care Team:   Nicolasa Ducking, NP Eula Listen, PA-C Marisue Ivan, PA-C [facility] [place], [place]   Other Instructions    Signed, Debbe Odea, MD  10/04/2020 4:53 PM    [facility]

## 2020-11-12 ENCOUNTER — Ambulatory Visit: Payer: BLUE CROSS/BLUE SHIELD | Admitting: Cardiology

## 2020-11-19 ENCOUNTER — Encounter: Payer: Self-pay | Admitting: Family

## 2020-12-05 ENCOUNTER — Telehealth: Payer: Self-pay | Admitting: Cardiology

## 2020-12-05 NOTE — Telephone Encounter (Signed)
Patient wants to know if a cardiac MRI can be ordered  Prior to upcoming 3 m fu    Patient also wants to see a different provider upon fu .  She states she received a 2 min visit last time and was documented and charged for a 60 min exam.    Patient states she would like to see a provider that cares more about his patients .   Patient will wait until Tuesday and call back if no response .       Fwd ing to nursing first then to MD's to ask if ok to change providers .

## 2020-12-06 ENCOUNTER — Other Ambulatory Visit: Payer: Self-pay

## 2020-12-06 ENCOUNTER — Other Ambulatory Visit (INDEPENDENT_AMBULATORY_CARE_PROVIDER_SITE_OTHER): Payer: BLUE CROSS/BLUE SHIELD

## 2020-12-06 DIAGNOSIS — E785 Hyperlipidemia, unspecified: Secondary | ICD-10-CM | POA: Diagnosis not present

## 2020-12-07 LAB — LIPID PANEL
Chol/HDL Ratio: 3.9 ratio (ref 0.0–4.4)
Cholesterol, Total: 209 mg/dL — ABNORMAL HIGH (ref 100–199)
HDL: 53 mg/dL (ref >39)
LDL Chol Calc (NIH): 147 mg/dL — ABNORMAL HIGH (ref 0–99)
Triglycerides: 48 mg/dL (ref 0–149)
VLDL Cholesterol Cal: 9 mg/dL (ref 5–40)

## 2020-12-09 NOTE — Telephone Encounter (Signed)
Called patient regarding her request for a Cardiac MRI, patient stated she got her lab work back from her PCP on Friday November 4,2022 and levels are much better from the supplements she has been taking and no longer would like a cardiac MRI.  Patient was appreciative of the phone call and will follow up with primary care with any other needs.

## 2020-12-09 NOTE — Telephone Encounter (Signed)
Noted  

## 2020-12-10 ENCOUNTER — Encounter: Payer: BLUE CROSS/BLUE SHIELD | Admitting: Genetic Counselor

## 2021-01-07 ENCOUNTER — Ambulatory Visit: Payer: BLUE CROSS/BLUE SHIELD | Admitting: Cardiology

## 2021-02-10 ENCOUNTER — Other Ambulatory Visit: Payer: Self-pay | Admitting: Adult Health

## 2021-02-10 ENCOUNTER — Telehealth: Payer: Self-pay | Admitting: Adult Health

## 2021-02-10 DIAGNOSIS — E785 Hyperlipidemia, unspecified: Secondary | ICD-10-CM

## 2021-02-10 DIAGNOSIS — Z1389 Encounter for screening for other disorder: Secondary | ICD-10-CM

## 2021-02-10 DIAGNOSIS — R7989 Other specified abnormal findings of blood chemistry: Secondary | ICD-10-CM

## 2021-02-10 DIAGNOSIS — E663 Overweight: Secondary | ICD-10-CM

## 2021-02-10 NOTE — Progress Notes (Signed)
Orders Placed This Encounter  Procedures   Comprehensive metabolic panel    Standing Status:   Future    Standing Expiration Date:   08/10/2021   TSH    Standing Status:   Future    Standing Expiration Date:   08/10/2021   Vitamin D 1,25 dihydroxy    Standing Status:   Future    Standing Expiration Date:   02/10/2022   Urinalysis, Routine w reflex microscopic    Standing Status:   Future    Standing Expiration Date:   08/10/2021   CBC with Differential/Platelet    Standing Status:   Future    Standing Expiration Date:   02/10/2022   Hemoglobin A1c    Standing Status:   Future    Standing Expiration Date:   02/10/2022   Lipid panel    Standing Status:   Future    Standing Expiration Date:   02/10/2022   Cortisol    Standing Status:   Future    Standing Expiration Date:   02/10/2022

## 2021-02-10 NOTE — Telephone Encounter (Signed)
Placed call to pt to schedule lab appt. Lab appt scheduled

## 2021-02-10 NOTE — Telephone Encounter (Signed)
Orders Placed This Encounter  Procedures   Comprehensive metabolic panel    Standing Status:   Future    Standing Expiration Date:   08/10/2021   TSH    Standing Status:   Future    Standing Expiration Date:   08/10/2021   Vitamin D 1,25 dihydroxy    Standing Status:   Future    Standing Expiration Date:   02/10/2022   Urinalysis, Routine w reflex microscopic    Standing Status:   Future    Standing Expiration Date:   08/10/2021   CBC with Differential/Platelet    Standing Status:   Future    Standing Expiration Date:   02/10/2022   Hemoglobin A1c    Standing Status:   Future    Standing Expiration Date:   02/10/2022   Lipid panel    Standing Status:   Future    Standing Expiration Date:   02/10/2022   Cortisol    Standing Status:   Future    Standing Expiration Date:   02/10/2022   Labs ordered as above. Please call patient and let her know.

## 2021-02-10 NOTE — Telephone Encounter (Signed)
Pt called in to schedule lab orders. Looked in Pt chart, no lab orders. Pt is requesting a complete work up on lab orders. Pt requesting callback.

## 2021-02-12 ENCOUNTER — Other Ambulatory Visit: Payer: Self-pay | Admitting: Adult Health

## 2021-02-12 ENCOUNTER — Other Ambulatory Visit: Payer: Self-pay | Admitting: Family

## 2021-02-12 DIAGNOSIS — Z1231 Encounter for screening mammogram for malignant neoplasm of breast: Secondary | ICD-10-CM

## 2021-02-17 ENCOUNTER — Other Ambulatory Visit: Payer: Self-pay

## 2021-02-17 ENCOUNTER — Encounter: Payer: Self-pay | Admitting: Adult Health

## 2021-02-17 ENCOUNTER — Other Ambulatory Visit (INDEPENDENT_AMBULATORY_CARE_PROVIDER_SITE_OTHER): Payer: BLUE CROSS/BLUE SHIELD

## 2021-02-17 ENCOUNTER — Encounter: Payer: Self-pay | Admitting: Cardiovascular Disease

## 2021-02-17 DIAGNOSIS — E663 Overweight: Secondary | ICD-10-CM | POA: Diagnosis not present

## 2021-02-17 DIAGNOSIS — Z1389 Encounter for screening for other disorder: Secondary | ICD-10-CM

## 2021-02-17 DIAGNOSIS — R7989 Other specified abnormal findings of blood chemistry: Secondary | ICD-10-CM | POA: Diagnosis not present

## 2021-02-17 DIAGNOSIS — E785 Hyperlipidemia, unspecified: Secondary | ICD-10-CM | POA: Diagnosis not present

## 2021-02-17 LAB — COMPREHENSIVE METABOLIC PANEL
ALT: 10 U/L (ref 0–35)
AST: 17 U/L (ref 0–37)
Albumin: 4.5 g/dL (ref 3.5–5.2)
Alkaline Phosphatase: 37 U/L — ABNORMAL LOW (ref 39–117)
BUN: 15 mg/dL (ref 6–23)
CO2: 29 mEq/L (ref 19–32)
Calcium: 9.2 mg/dL (ref 8.4–10.5)
Chloride: 105 mEq/L (ref 96–112)
Creatinine, Ser: 0.62 mg/dL (ref 0.40–1.20)
GFR: 114.96 mL/min (ref >60.00)
Glucose, Bld: 86 mg/dL (ref 70–99)
Potassium: 4.4 mEq/L (ref 3.5–5.1)
Sodium: 142 mEq/L (ref 135–145)
Total Bilirubin: 0.7 mg/dL (ref 0.2–1.2)
Total Protein: 6.7 g/dL (ref 6.0–8.3)

## 2021-02-17 LAB — URINALYSIS, ROUTINE W REFLEX MICROSCOPIC
Bilirubin Urine: NEGATIVE
Hgb urine dipstick: NEGATIVE
Nitrite: NEGATIVE
Specific Gravity, Urine: >=1.03 — AB (ref 1.000–1.030)
Urine Glucose: NEGATIVE
Urobilinogen, UA: 0.2 (ref 0.0–1.0)
pH: 6 (ref 5.0–8.0)

## 2021-02-17 LAB — CBC WITH DIFFERENTIAL/PLATELET
Basophils Absolute: 0 10*3/uL (ref 0.0–0.1)
Basophils Relative: 0.5 % (ref 0.0–3.0)
Eosinophils Absolute: 0.1 10*3/uL (ref 0.0–0.7)
Eosinophils Relative: 1.7 % (ref 0.0–5.0)
HCT: 38 % (ref 36.0–46.0)
Hemoglobin: 12.9 g/dL (ref 12.0–15.0)
Lymphocytes Relative: 34.1 % (ref 12.0–46.0)
Lymphs Abs: 1.9 10*3/uL (ref 0.7–4.0)
MCHC: 34.1 g/dL (ref 30.0–36.0)
MCV: 86.3 fl (ref 78.0–100.0)
Monocytes Absolute: 0.4 10*3/uL (ref 0.1–1.0)
Monocytes Relative: 6.7 % (ref 3.0–12.0)
Neutro Abs: 3.2 10*3/uL (ref 1.4–7.7)
Neutrophils Relative %: 57 % (ref 43.0–77.0)
Platelets: 253 10*3/uL (ref 150.0–400.0)
RBC: 4.4 Mil/uL (ref 3.87–5.11)
RDW: 13 % (ref 11.5–15.5)
WBC: 5.6 10*3/uL (ref 4.0–10.5)

## 2021-02-17 LAB — HEMOGLOBIN A1C: Hgb A1c MFr Bld: 4.7 % (ref 4.6–6.5)

## 2021-02-17 LAB — LIPID PANEL
Cholesterol: 216 mg/dL — ABNORMAL HIGH (ref 0–200)
HDL: 53 mg/dL (ref >39.00)
LDL Cholesterol: 146 mg/dL — ABNORMAL HIGH (ref 0–99)
NonHDL: 162.81
Total CHOL/HDL Ratio: 4
Triglycerides: 84 mg/dL (ref 0.0–149.0)
VLDL: 16.8 mg/dL (ref 0.0–40.0)

## 2021-02-17 LAB — TSH: TSH: 1.04 u[IU]/mL (ref 0.35–5.50)

## 2021-02-17 LAB — CORTISOL: Cortisol, Plasma: 10.2 ug/dL

## 2021-02-17 NOTE — Progress Notes (Signed)
Total cholesterol and LDL elevated.  Discuss lifestyle modification with patient e.g. increase exercise, fiber, fruits, vegetables, lean meat, and omega 3/fish intake and decrease saturated fat.  If patient following strict diet and exercise program already please schedule follow up appointment with primary care physician. Hemoglobin A1C is within normal limits.  CMP OK.  CORTISOL WITHIN NORMAL.  CBC is within normal limits.  Tsh is within normal for thyroid.   Vitamin d is pending. Need to add on urine culture or have her return based on urinalysis results. Thanks.

## 2021-02-18 ENCOUNTER — Other Ambulatory Visit (HOSPITAL_COMMUNITY)
Admission: RE | Admit: 2021-02-18 | Discharge: 2021-02-18 | Disposition: A | Discharge status: Home / Self Care | Payer: BLUE CROSS/BLUE SHIELD | Source: Ambulatory Visit | Attending: Adult Health | Admitting: Adult Health

## 2021-02-18 ENCOUNTER — Encounter: Payer: Self-pay | Admitting: Adult Health

## 2021-02-18 ENCOUNTER — Ambulatory Visit (INDEPENDENT_AMBULATORY_CARE_PROVIDER_SITE_OTHER): Payer: BLUE CROSS/BLUE SHIELD | Admitting: Adult Health

## 2021-02-18 VITALS — BP 116/78 | HR 84 | Ht 62.52 in | Wt 133.6 lb

## 2021-02-18 DIAGNOSIS — E559 Vitamin D deficiency, unspecified: Secondary | ICD-10-CM | POA: Diagnosis not present

## 2021-02-18 DIAGNOSIS — Z803 Family history of malignant neoplasm of breast: Secondary | ICD-10-CM

## 2021-02-18 DIAGNOSIS — R8290 Unspecified abnormal findings in urine: Secondary | ICD-10-CM

## 2021-02-18 DIAGNOSIS — Z01419 Encounter for gynecological examination (general) (routine) without abnormal findings: Secondary | ICD-10-CM | POA: Diagnosis not present

## 2021-02-18 DIAGNOSIS — Z6824 Body mass index (BMI) 24.0-24.9, adult: Secondary | ICD-10-CM

## 2021-02-18 DIAGNOSIS — E785 Hyperlipidemia, unspecified: Secondary | ICD-10-CM | POA: Diagnosis not present

## 2021-02-18 DIAGNOSIS — R7989 Other specified abnormal findings of blood chemistry: Secondary | ICD-10-CM

## 2021-02-18 LAB — URINALYSIS, ROUTINE W REFLEX MICROSCOPIC
Bilirubin Urine: NEGATIVE
Hgb urine dipstick: NEGATIVE
Ketones, ur: NEGATIVE
Leukocytes,Ua: NEGATIVE
Nitrite: NEGATIVE
RBC / HPF: NONE SEEN (ref 0–?)
Specific Gravity, Urine: 1.025 (ref 1.000–1.030)
Total Protein, Urine: NEGATIVE
Urine Glucose: NEGATIVE
Urobilinogen, UA: 0.2 (ref 0.0–1.0)
pH: 6 (ref 5.0–8.0)

## 2021-02-18 NOTE — Assessment & Plan Note (Signed)
Yearly mammogram is advised.

## 2021-02-18 NOTE — Assessment & Plan Note (Signed)
Repeat urinalysis and urine culture she did not do a clean catch.

## 2021-02-18 NOTE — Assessment & Plan Note (Signed)
Stable 5 months ago recheck in June lab orders in .

## 2021-02-18 NOTE — Patient Instructions (Addendum)
Pap Test Why am I having this test? A Pap test, also called a Pap smear, is a screening test to check for signs of: Infection. Cancer of the cervix. The cervix is the lower part of the uterus that opens into the vagina. Changes that may be a sign that cancer is developing (precancerous changes). Women need this test on a regular basis. In general, you should have a Pap test every 3 years until you reach menopause or age 36. Women aged 30-60 may choose to have their Pap test done at the same time as an HPV (human papillomavirus) test every 5 years (instead of every 3 years). Your health care provider may recommend having Pap tests more or less often depending on your medical conditions and past Pap test results. What is being tested? Cervical cells are tested for signs of infection or abnormalities. What kind of sample is taken? Your health care provider will collect a sample of cells from the surface of your cervix. This will be done using a small cotton swab, plastic spatula, or brush that is inserted into your vagina using a tool called a speculum. This sample is often collected during a pelvic exam, when you are lying on your back on an exam table with your feet in footrests (stirrups). In some cases, fluids (secretions) from the cervix or vagina may also be collected. How do I prepare for this test? Be aware of where you are in your menstrual cycle. If you are menstruating on the day of the test, you may be asked to reschedule. You may need to reschedule if you have a known vaginal infection on the day of the test. Follow instructions from your health care provider about: Changing or stopping your regular medicines. Some medicines can cause abnormal test results, such as vaginal medicines and tetracycline. Avoiding douching 2-3 days before or the day of the test. Tell a health care provider about: Any allergies you have. All medicines you are taking, including vitamins, herbs, eye drops,  creams, and over-the-counter medicines. Any bleeding problems you have. Any surgeries you have had. Any medical conditions you have. Whether you are pregnant or may be pregnant. How are the results reported? Your test results will be reported as either abnormal or normal. What do the results mean? A normal test result means that you do not have signs of cancer of the cervix. An abnormal result may mean that you have: Cancer. A Pap test by itself is not enough to diagnose cancer. You will have more tests done if cancer is suspected. Precancerous changes in your cervix. Inflammation of the cervix. An STI (sexually transmitted infection). A fungal infection. A parasite infection. Talk with your health care provider about what your results mean. In some cases, your health care provider may do more testing to confirm the results. Questions to ask your health care provider Ask your health care provider, or the department that is doing the test: When will my results be ready? How will I get my results? What are my treatment options? What other tests do I need? What are my next steps? Summary In general, women should have a Pap test every 3 years until they reach menopause or age 52. Your health care provider will collect a sample of cells from the surface of your cervix. This will be done using a small cotton swab, plastic spatula, or brush. In some cases, fluids (secretions) from the cervix or vagina may also be collected. This information is not  intended to replace advice given to you by your health care provider. Make sure you discuss any questions you have with your health care provider. Document Revised: 04/19/2020 Document Reviewed: 04/19/2020 Elsevier Patient Education  2022 [employer] Cholesterol Content in Foods Cholesterol is a waxy, fat-like substance that helps to carry fat in the blood. The body needs cholesterol in small amounts, but too much cholesterol can cause damage to  the arteries and heart. What foods have cholesterol? Cholesterol is found in animal-based foods, such as meat, seafood, and dairy. Generally, low-fat dairy and lean meats have less cholesterol than full-fat dairy and fatty meats. The milligrams of cholesterol per serving (mg per serving) of common cholesterol-containing foods are listed below. Meats and other proteins Egg -- one large whole egg has 186 mg. Veal shank -- 4 oz (113 g) has 141 mg. Lean ground [place] (93% lean) -- 4 oz (113 g) has 118 mg. Fat-trimmed lamb loin -- 4 oz (113 g) has 106 mg. Lean ground beef (90% lean) -- 4 oz (113 g) has 100 mg. Lobster -- 3.5 oz (99 g) has 90 mg. Pork loin chops -- 4 oz (113 g) has 86 mg. Canned salmon -- 3.5 oz (99 g) has 83 mg. Fat-trimmed beef top loin -- 4 oz (113 g) has 78 mg. Frankfurter -- 1 frank (3.5 oz or 99 g) has 77 mg. Crab -- 3.5 oz (99 g) has 71 mg. Roasted chicken without skin, white meat -- 4 oz (113 g) has 66 mg. Light bologna -- 2 oz (57 g) has 45 mg. Deli-cut [place] -- 2 oz (57 g) has 31 mg. Canned tuna -- 3.5 oz (99 g) has 31 mg. Tomasa Blase -- 1 oz (28 g) has 29 mg. Oysters and mussels (raw) -- 3.5 oz (99 g) has 25 mg. Mackerel -- 1 oz (28 g) has 22 mg. Trout -- 1 oz (28 g) has 20 mg. Pork sausage -- 1 link (1 oz or 28 g) has 17 mg. Salmon -- 1 oz (28 g) has 16 mg. Tilapia -- 1 oz (28 g) has 14 mg. Dairy Soft-serve ice cream --  cup (4 oz or 86 g) has 103 mg. Whole-milk yogurt -- 1 cup (8 oz or 245 g) has 29 mg. Cheddar cheese -- 1 oz (28 g) has 28 mg. American cheese -- 1 oz (28 g) has 28 mg. Whole milk -- 1 cup (8 oz or 250 mL) has 23 mg. 2% milk -- 1 cup (8 oz or 250 mL) has 18 mg. Cream cheese -- 1 tablespoon (Tbsp) (14.5 g) has 15 mg. Cottage cheese --  cup (4 oz or 113 g) has 14 mg. Low-fat (1%) milk -- 1 cup (8 oz or 250 mL) has 10 mg. Sour cream -- 1 Tbsp (12 g) has 8.5 mg. Low-fat yogurt -- 1 cup (8 oz or 245 g) has 8 mg. Nonfat Greek yogurt -- 1 cup (8 oz  or 228 g) has 7 mg. Half-and-half cream -- 1 Tbsp (15 mL) has 5 mg. Fats and oils Cod liver oil -- 1 tablespoon (Tbsp) (13.6 g) has 82 mg. Butter -- 1 Tbsp (14 g) has 15 mg. Lard -- 1 Tbsp (12.8 g) has 14 mg. Bacon grease -- 1 Tbsp (12.9 g) has 14 mg. Mayonnaise -- 1 Tbsp (13.8 g) has 5-10 mg. Margarine -- 1 Tbsp (14 g) has 3-10 mg. The items listed above may not be a complete list of foods with cholesterol. Exact amounts of cholesterol in  these foods may vary depending on specific ingredients and brands. Contact a dietitian for more information. What foods do not have cholesterol? Most plant-based foods do not have cholesterol unless you combine them with a food that has cholesterol. Foods without cholesterol include: Grains and cereals. Vegetables. Fruits. Vegetable oils, such as olive, canola, and sunflower oil. Legumes, such as peas, beans, and lentils. Nuts and seeds. Egg whites. The items listed above may not be a complete list of foods that do not have cholesterol. Contact a dietitian for more information. Summary The body needs cholesterol in small amounts, but too much cholesterol can cause damage to the arteries and heart. Cholesterol is found in animal-based foods, such as meat, seafood, and dairy. Generally, low-fat dairy and lean meats have less cholesterol than full-fat dairy and fatty meats. This information is not intended to replace advice given to you by your health care provider. Make sure you discuss any questions you have with your health care provider. Document Revised: 05/31/2020 Document Reviewed: 05/31/2020 Elsevier Patient Education  2022 [employer] High Cholesterol High cholesterol is a condition in which the blood has high levels of a white, waxy substance similar to fat (cholesterol). The liver makes all the cholesterol that the body needs. The human body needs small amounts of cholesterol to help build cells. A person gets extra or excess cholesterol from  the food that he or she eats. The blood carries cholesterol from the liver to the rest of the body. If you have high cholesterol, deposits (plaques) may build up on the walls of your arteries. Arteries are the blood vessels that carry blood away from your heart. These plaques make the arteries narrow and stiff. Cholesterol plaques increase your risk for heart attack and stroke. Work with your health care provider to keep your cholesterol levels in a healthy range. What increases the risk? The following factors may make you more likely to develop this condition: Eating foods that are high in animal fat (saturated fat) or cholesterol. Being overweight. Not getting enough exercise. A family history of high cholesterol (familial hypercholesterolemia). Use of tobacco products. Having diabetes. What are the signs or symptoms? In most cases, high cholesterol does not usually cause any symptoms. In severe cases, very high cholesterol levels can cause: Fatty bumps under the skin (xanthomas). A white or gray ring around the black center (pupil) of the eye. How is this diagnosed? This condition may be diagnosed based on the results of a blood test. If you are older than 36 years of age, your health care provider may check your cholesterol levels every 4-6 years. You may be checked more often if you have high cholesterol or other risk factors for heart disease. The blood test for cholesterol measures: "Bad" cholesterol, or LDL cholesterol. This is the main type of cholesterol that causes heart disease. The desired level is less than 100 mg/dL (7.82 mmol/L). "Good" cholesterol, or HDL cholesterol. HDL helps protect against heart disease by cleaning the arteries and carrying the LDL to the liver for processing. The desired level for HDL is 60 mg/dL (9.56 mmol/L) or higher. Triglycerides. These are fats that your body can store or burn for energy. The desired level is less than 150 mg/dL (2.13  mmol/L). Total cholesterol. This measures the total amount of cholesterol in your blood and includes LDL, HDL, and triglycerides. The desired level is less than 200 mg/dL (0.86 mmol/L). How is this treated? Treatment for high cholesterol starts with lifestyle changes, such as diet  and exercise. Diet changes. You may be asked to eat foods that have more fiber and less saturated fats or added sugar. Lifestyle changes. These may include regular exercise, maintaining a healthy weight, and quitting use of tobacco products. Medicines. These are given when diet and lifestyle changes have not worked. You may be prescribed a statin medicine to help lower your cholesterol levels. Follow these instructions at home: Eating and drinking  Eat a healthy, balanced diet. This diet includes: Daily servings of a variety of fresh, frozen, or canned fruits and vegetables. Daily servings of whole grain foods that are rich in fiber. Foods that are low in saturated fats and trans fats. These include poultry and fish without skin, lean cuts of meat, and low-fat dairy products. A variety of fish, especially oily fish that contain omega-3 fatty acids. Aim to eat fish at least 2 times a week. Avoid foods and drinks that have added sugar. Use healthy cooking methods, such as roasting, grilling, broiling, baking, poaching, steaming, and stir-frying. Do not fry your food except for stir-frying. If you drink alcohol: Limit how much you have to: 0-1 drink a day for women who are not pregnant. 0-2 drinks a day for men. Know how much alcohol is in a drink. In the [place] one drink equals one 12 oz bottle of beer (355 mL), one 5 oz glass of wine (148 mL), or one 1 oz glass of hard liquor (44 mL). Lifestyle  Get regular exercise. Aim to exercise for a total of 150 minutes a week. Increase your activity level by doing activities such as gardening, walking, and taking the stairs. Do not use any products that contain nicotine or  tobacco. These products include cigarettes, chewing tobacco, and vaping devices, such as e-cigarettes. If you need help quitting, ask your health care provider. General instructions Take over-the-counter and prescription medicines only as told by your health care provider. Keep all follow-up visits. This is important. Where to find more information American Heart Association: www.heart.org National Heart, Lung, and Blood Institute: PopSteam.iswww.nhlbi.nih.gov Contact a health care provider if: You have trouble achieving or maintaining a healthy diet or weight. You are starting an exercise program. You are unable to stop smoking. Get help right away if: You have chest pain. You have trouble breathing. You have discomfort or pain in your jaw, neck, back, shoulder, or arm. You have any symptoms of a stroke. "BE FAST" is an easy way to remember the main warning signs of a stroke: B - Balance. Signs are dizziness, sudden trouble walking, or loss of balance. E - Eyes. Signs are trouble seeing or a sudden change in vision. F - Face. Signs are sudden weakness or numbness of the face, or the face or eyelid drooping on one side. A - Arms. Signs are weakness or numbness in an arm. This happens suddenly and usually on one side of the body. S - Speech. Signs are sudden trouble speaking, slurred speech, or trouble understanding what people say. T - Time. Time to call emergency services. Write down what time symptoms started. You have other signs of a stroke, such as: A sudden, severe headache with no known cause. Nausea or vomiting. Seizure. These symptoms may represent a serious problem that is an emergency. Do not wait to see if the symptoms will go away. Get medical help right away. Call your local emergency services (911 in the [place] Do not drive yourself to the hospital. Summary Cholesterol plaques increase your risk for heart attack and  stroke. Work with your health care provider to keep your cholesterol  levels in a healthy range. Eat a healthy, balanced diet, get regular exercise, and maintain a healthy weight. Do not use any products that contain nicotine or tobacco. These products include cigarettes, chewing tobacco, and vaping devices, such as e-cigarettes. Get help right away if you have any symptoms of a stroke. This information is not intended to replace advice given to you by your health care provider. Make sure you discuss any questions you have with your health care provider. Document Revised: 04/04/2020 Document Reviewed: 03/25/2020 Elsevier Patient Education  2022 [employer]. Preventing High Cholesterol Cholesterol is a white, waxy substance similar to fat that the human body needs to help build cells. The liver makes all the cholesterol that a person's body needs. Having high cholesterol (hypercholesterolemia) increases your risk for heart disease and stroke. Extra or excess cholesterol comes from the food that you eat. High cholesterol can often be prevented with diet and lifestyle changes. If you already have high cholesterol, you can control it with diet, lifestyle changes, and medicines. How can high cholesterol affect me? If you have high cholesterol, fatty deposits (plaques) may build up on the walls of your blood vessels. The blood vessels that carry blood away from your heart are called arteries. Plaques make the arteries narrower and stiffer. This in turn can: Restrict or block blood flow and cause blood clots to form. Increase your risk for heart attack and stroke. What can increase my risk for high cholesterol? This condition is more likely to develop in people who: Eat foods that are high in saturated fat or cholesterol. Saturated fat is mostly found in foods that come from animal sources. Are overweight. Are not getting enough exercise. Use products that contain nicotine or tobacco, such as cigarettes, e-cigarettes, and chewing tobacco. Have a family history of high  cholesterol (familial hypercholesterolemia). What actions can I take to prevent this? Nutrition  Eat less saturated fat. Avoid trans fats (partially hydrogenated oils). These are often found in margarine and in some baked goods, fried foods, and snacks bought in packages. Avoid precooked or cured meat, such as bacon, sausages, or meat loaves. Avoid foods and drinks that have added sugars. Eat more fruits, vegetables, and whole grains. Choose healthy sources of protein, such as fish, poultry, lean cuts of red meat, beans, peas, lentils, and nuts. Choose healthy sources of fat, such as: Nuts. Vegetable oils, especially olive oil. Fish that have healthy fats, such as omega-3 fatty acids. These fish include mackerel or salmon. Lifestyle Lose weight if you are overweight. Maintaining a healthy body mass index (BMI) can help prevent or control high cholesterol. It can also lower your risk for diabetes and high blood pressure. Ask your health care provider to help you with a diet and exercise plan to lose weight safely. Do not use any products that contain nicotine or tobacco. These products include cigarettes, chewing tobacco, and vaping devices, such as e-cigarettes. If you need help quitting, ask your health care provider. Alcohol use Do not drink alcohol if: Your health care provider tells you not to drink. You are pregnant, may be pregnant, or are planning to become pregnant. If you drink alcohol: Limit how much you have to: 0-1 drink a day for women. 0-2 drinks a day for men. Know how much alcohol is in your drink. In the [place] one drink equals one 12 oz bottle of beer (355 mL), one 5 oz glass of wine (  148 mL), or one 1 oz glass of hard liquor (44 mL). Activity  Get enough exercise. Do exercises as told by your health care provider. Each week, do at least 150 minutes of exercise that takes a medium level of effort (moderate-intensity exercise). This kind of exercise: Makes your heart  beat faster while allowing you to still be able to talk. Can be done in short sessions several times a day or longer sessions a few times a week. For example, on 5 days each week, you could walk fast or ride your bike 3 times a day for 10 minutes each time. Medicines Your health care provider may recommend medicines to help lower cholesterol. This may be a medicine to lower the amount of cholesterol that your liver makes. You may need medicine if: Diet and lifestyle changes have not lowered your cholesterol enough. You have high cholesterol and other risk factors for heart disease or stroke. Take over-the-counter and prescription medicines only as told by your health care provider. General information Manage your risk factors for high cholesterol. Talk with your health care provider about all your risk factors and how to lower your risk. Manage other conditions that you have, such as diabetes or high blood pressure (hypertension). Have blood tests to check your cholesterol levels at regular points in time as told by your health care provider. Keep all follow-up visits. This is important. Where to find more information American Heart Association: www.heart.org National Heart, Lung, and Blood Institute: PopSteam.is Summary High cholesterol increases your risk for heart disease and stroke. By keeping your cholesterol level low, you can reduce your risk for these conditions. High cholesterol can often be prevented with diet and lifestyle changes. Work with your health care provider to manage your risk factors, and have your blood tested regularly. This information is not intended to replace advice given to you by your health care provider. Make sure you discuss any questions you have with your health care provider. Document Revised: 03/25/2020 Document Reviewed: 03/25/2020 Elsevier Patient Education  2022 [employer] Inc. Fat and Cholesterol Restricted Eating Plan Getting too much fat and  cholesterol in your diet may cause health problems. Choosing the right foods helps keep your fat and cholesterol at normal levels. This can keep you from getting certain diseases. Your doctor may recommend an eating plan that includes: Total fat: ______% or less of total calories a day. This is ______g of fat a day. Saturated fat: ______% or less of total calories a day. This is ______g of saturated fat a day. Cholesterol: less than _________mg a day. Fiber: ______g a day. What are tips for following this plan? General tips Work with your doctor to lose weight if you need to. Avoid: Foods with added sugar. Fried foods. Foods with trans fat or partially hydrogenated oils. This includes some margarines and baked goods. If you drink alcohol: Limit how much you have to: 0-1 drink a day for women who are not pregnant. 0-2 drinks a day for men. Know how much alcohol is in a drink. In the [place] one drink equals one 12 oz bottle of beer (355 mL), one 5 oz glass of wine (148 mL), or one 1 oz glass of hard liquor (44 mL). Reading food labels Check food labels for: Trans fats. Partially hydrogenated oils. Saturated fat (g) in each serving. Cholesterol (mg) in each serving. Fiber (g) in each serving. Choose foods with healthy fats, such as: Monounsaturated fats and polyunsaturated fats. These include olive and canola oil,  flaxseeds, walnuts, almonds, and seeds. Omega-3 fats. These are found in certain fish, flaxseed oil, and ground flaxseeds. Choose grain products that have whole grains. Look for the word "whole" as the first word in the ingredient list. Cooking Cook foods using low-fat methods. These include baking, boiling, grilling, and broiling. Eat more home-cooked foods. Eat at restaurants and buffets less often. Eat less fast food. Avoid cooking using saturated fats, such as butter, cream, palm oil, palm kernel oil, and coconut oil. Meal planning  At meals, divide your plate into four  equal parts: Fill one-half of your plate with vegetables, green salads, and fruit. Fill one-fourth of your plate with whole grains. Fill one-fourth of your plate with low-fat (lean) protein foods. Eat fish that is high in omega-3 fats at least two times a week. This includes mackerel, tuna, sardines, and salmon. Eat foods that are high in fiber, such as whole grains, beans, apples, pears, berries, broccoli, carrots, peas, and barley. What foods should I eat? Fruits All fresh, canned (in natural juice), or frozen fruits. Vegetables Fresh or frozen vegetables (raw, steamed, roasted, or grilled). Green salads. Grains Whole grains, such as whole wheat or whole grain breads, crackers, cereals, and pasta. Unsweetened oatmeal, bulgur, barley, quinoa, or brown rice. Corn or whole wheat flour tortillas. Meats and other protein foods Ground beef (85% or leaner), grass-fed beef, or beef trimmed of fat. Skinless chicken or [place]. Ground chicken or [place]. Pork trimmed of fat. All fish and seafood. Egg whites. Dried beans, peas, or lentils. Unsalted nuts or seeds. Unsalted canned beans. Nut butters without added sugar or oil. Dairy Low-fat or nonfat dairy products, such as skim or 1% milk, 2% or reduced-fat cheeses, low-fat and fat-free ricotta or cottage cheese, or plain low-fat and nonfat yogurt. Fats and oils Tub margarine without trans fats. Light or reduced-fat mayonnaise and salad dressings. Avocado. Olive, canola, sesame, or safflower oils. The items listed above may not be a complete list of foods and beverages you can eat. Contact a dietitian for more information. What foods should I avoid? Fruits Canned fruit in heavy syrup. Fruit in cream or butter sauce. Fried fruit. Vegetables Vegetables cooked in cheese, cream, or butter sauce. Fried vegetables. Grains White bread. White pasta. White rice. Cornbread. Bagels, pastries, and croissants. Crackers and snack foods that contain trans fat and  hydrogenated oils. Meats and other protein foods Fatty cuts of meat. Ribs, chicken wings, bacon, sausage, bologna, salami, chitterlings, fatback, hot dogs, bratwurst, and packaged lunch meats. Liver and organ meats. Whole eggs and egg yolks. Chicken and [place] with skin. Fried meat. Dairy Whole or 2% milk, cream, half-and-half, and cream cheese. Whole milk cheeses. Whole-fat or sweetened yogurt. Full-fat cheeses. Nondairy creamers and whipped toppings. Processed cheese, cheese spreads, and cheese curds. Fats and oils Butter, stick margarine, lard, shortening, ghee, or bacon fat. Coconut, palm kernel, and palm oils. Beverages Alcohol. Sugar-sweetened drinks such as sodas, lemonade, and fruit drinks. Sweets and desserts Corn syrup, sugars, honey, and molasses. Candy. Jam and jelly. Syrup. Sweetened cereals. Cookies, pies, cakes, donuts, muffins, and ice cream. The items listed above may not be a complete list of foods and beverages you should avoid. Contact a dietitian for more information. Summary Choosing the right foods helps keep your fat and cholesterol at normal levels. This can keep you from getting certain diseases. At meals, fill one-half of your plate with vegetables, green salads, and fruits. Eat high fiber foods, like whole grains, beans, apples, pears, berries, carrots, peas,  and barley. Limit added sugar, saturated fats, alcohol, and fried foods. This information is not intended to replace advice given to you by your health care provider. Make sure you discuss any questions you have with your health care provider. Document Revised: 05/31/2020 Document Reviewed: 05/31/2020 Elsevier Patient Education  2022 [employer].

## 2021-02-18 NOTE — Assessment & Plan Note (Signed)
She requests yearly pap she will be out of town 03/2020.

## 2021-02-18 NOTE — Assessment & Plan Note (Signed)
Tried Lipitor did not want due to side effects. Working on diet and exercise.tried niacin stopped due to side effects.  She is planning on trying red yeast rice and continue diet and exercise.

## 2021-02-18 NOTE — Progress Notes (Signed)
Established Patient Office Visit  Subjective:  Patient ID: Jo Gibson, female    DOB: March 06, 1985  Age: 36 y.o. MRN: [id number]  CC:  Chief Complaint  Patient presents with   Gynecologic Exam    HPI Jo Gibson presents for follow up on her cholesterol, she has seen Dr. Mylo Red on 11/19/20 was started on Lipitor 40 mg once daily. Has follow up with Dr. Rockey Situ cardiology on 03/05/20. She was taking some niacin but did stop it due to having some tight feelings in her chest it was non flush niacin and she has had these symptom resolve since stopping it. She is thinking of trying red yeast rice sje prefers not to take a statin due to side effects. She is exercising on a schedule and eating a clean diet.History of familial hyperlipidemia.   Patient requests repeat PAP smear today, she declined need for STD testing, she wants repeat today, and is aware insurance may not pay for this as last was 8 months ago and within normal limits with transformation zone present. No bleeding. Denies any bleeding or pain.    Cytology - PAP Order: [id number] Status: Edited Result - FINAL    Visible to patient: Yes (seen)     Dx: Pap smear, as part of routine gynecol...    2 Result Notes   1 Patient Communication   1 HM Topic    Component 8 mo ago  High risk HPV Negative   Adequacy Satisfactory for evaluation; transformation zone component PRESENT.   Diagnosis - Negative for intraepithelial lesion or malignancy (NILM)   Comment Normal Reference Range HPV - Negative   Resulting Agency [facility]      Patient  denies any fever, body aches,chills, rash, chest pain, shortness of breath, nausea, vomiting, or diarrhea.   No past medical history on file.  No past surgical history on file.  Family History  Problem Relation Age of Onset   Cancer Mother    Epilepsy Mother    Hypertension Father    Hyperlipidemia Father    Hypertension Maternal Grandmother    Hyperlipidemia Maternal Grandmother     Liver cancer Paternal Grandmother    Diabetes Paternal Grandfather        type 2   Thyroid disease Maternal Aunt     Social History   Socioeconomic History   Marital status: Married    Spouse name: Not on file   Number of children: Not on file   Years of education: Not on file   Highest education level: Not on file  Occupational History   Not on file  Tobacco Use   Smoking status: Never   Smokeless tobacco: Never  Substance and Sexual Activity   Alcohol use: Not Currently   Drug use: Never   Sexual activity: Yes  Other Topics Concern   Not on file  Social History Narrative   Not on file   Social Determinants of Health   Financial Resource Strain: Not on file  Food Insecurity: Not on file  Transportation Needs: Not on file  Physical Activity: Not on file  Stress: Not on file  Social Connections: Not on file  Intimate Partner Violence: Not on file    Outpatient Medications Prior to Visit  Medication Sig Dispense Refill   Cholecalciferol (VITAMIN D) 125 MCG (5000 UT) CAPS Take by mouth.     Omega-3 Fatty Acids (FISH OIL PO) Take by mouth.     ASHWAGANDHA PO Take by mouth. (Patient not taking:  Reported on 02/18/2021)     atorvastatin (LIPITOR) 40 MG tablet Take 1 tablet (40 mg total) by mouth daily. 30 tablet 5   No facility-administered medications prior to visit.    No Known Allergies  ROS Review of Systems  Constitutional: Negative.   HENT: Negative.    Eyes: Negative.   Respiratory: Negative.    Cardiovascular: Negative.   Gastrointestinal: Negative.   Genitourinary: Negative.   Musculoskeletal: Negative.   Skin: Negative.   Neurological: Negative.   Hematological: Negative.   Psychiatric/Behavioral: Negative.       Objective:    Physical Exam Abdominal:     Hernia: There is no hernia in the left inguinal area or right inguinal area.  Genitourinary:    General: Normal vulva.     Exam position: Lithotomy position.     Pubic Area: No rash or  pubic lice.      Tanner stage (genital): 5.     Labia:        Right: No rash, tenderness, lesion or injury.        Left: No rash, tenderness, lesion or injury.      Urethra: No prolapse, urethral pain, urethral swelling or urethral lesion.     Vagina: Normal.     Cervix: Normal.     Uterus: Normal.      Adnexa: Right adnexa normal and left adnexa normal.     Rectum: Normal.  Lymphadenopathy:     Lower Body: No right inguinal adenopathy. No left inguinal adenopathy.     General: Appearance:    Well developed, well nourished female in no acute distress  Eyes:    PERRL, conjunctiva/corneas clear, EOM's intact       Lungs:     Clear to auscultation bilaterally, respirations unlabored  Heart:    Normal heart rate. Normal rhythm. No murmurs, rubs, or gallops.    MS:   All extremities are intact.    Neurologic:   Awake, alert, oriented x 3. No apparent focal neurological           defect.      BP 116/78    Pulse 84    Ht 5' 2.52" (1.588 m)    Wt 133 lb 9.6 oz (60.6 kg)    SpO2 99%    BMI 24.03 kg/m  Wt Readings from Last 3 Encounters:  02/18/21 133 lb 9.6 oz (60.6 kg)  10/04/20 134 lb (60.8 kg)  09/23/20 136 lb (61.7 kg)   Vitals with BMI 02/18/2021 10/04/2020 09/23/2020  Height 5' 2.52" 5' 2.5" 5' 2.5"  Weight 133 lbs 10 oz 134 lbs 136 lbs  BMI 24.03 94.8 01.65  Systolic 537 482 -  Diastolic 78 60 -  Pulse 84 65 -     There are no preventive care reminders to display for this patient.   There are no preventive care reminders to display for this patient.  Lab Results  Component Value Date   TSH 1.04 02/17/2021   Lab Results  Component Value Date   WBC 5.6 02/17/2021   HGB 12.9 02/17/2021   HCT 38.0 02/17/2021   MCV 86.3 02/17/2021   PLT 253.0 02/17/2021   Lab Results  Component Value Date   NA 142 02/17/2021   K 4.4 02/17/2021   CO2 29 02/17/2021   GLUCOSE 86 02/17/2021   BUN 15 02/17/2021   CREATININE 0.62 02/17/2021   BILITOT 0.7 02/17/2021   ALKPHOS 37 (L)  02/17/2021   AST 17 02/17/2021  ALT 10 02/17/2021   PROT 6.7 02/17/2021   ALBUMIN 4.5 02/17/2021   CALCIUM 9.2 02/17/2021   EGFR 116 04/15/2020   GFR 114.96 02/17/2021   Lab Results  Component Value Date   CHOL 216 (H) 02/17/2021   Lab Results  Component Value Date   HDL 53.00 02/17/2021   Lab Results  Component Value Date   LDLCALC 146 (H) 02/17/2021   Lab Results  Component Value Date   TRIG 84.0 02/17/2021   Lab Results  Component Value Date   CHOLHDL 4 02/17/2021   Lab Results  Component Value Date   HGBA1C 4.7 02/17/2021      Assessment & Plan:   Problem List Items Addressed This Visit       Other   Low serum vitamin D    Stable 5 months ago recheck in June lab orders in .       HLD (hyperlipidemia) - Primary    Tried Lipitor did not want due to side effects. Working on diet and exercise.tried niacin stopped due to side effects.  She is planning on trying red yeast rice and continue diet and exercise.       Relevant Orders   Lipid panel   CBC with Differential/Platelet   Comprehensive metabolic panel   TSH   BMI 24.0-24.9, adult   Pap smear, as part of routine gynecological examination    She requests yearly pap she will be out of town 03/2020.       Relevant Orders   Cytology - PAP( [facility])   Abnormal urinalysis    Repeat urinalysis and urine culture she did not do a clean catch.      Relevant Orders   Urinalysis, Routine w reflex microscopic   Urine Culture   Vitamin D deficiency   Relevant Orders   VITAMIN D 25 Hydroxy (Vit-D Deficiency, Fractures)   Family history of breast cancer    Yearly mammogram is advised.       Relevant Orders   MM Digital Screening    Orders Placed This Encounter  Procedures   Urine Culture   MM Digital Screening    Standing Status:   Future    Standing Expiration Date:   02/18/2022    Order Specific Question:   Reason for Exam (SYMPTOM  OR DIAGNOSIS REQUIRED)    Answer:   screening family  history of breast cancer    Order Specific Question:   Is the patient pregnant?    Answer:   No    Order Specific Question:   Preferred imaging location?    Answer:   [facility]   Lipid panel    Standing Status:   Future    Standing Expiration Date:   02/18/2022   VITAMIN D 25 Hydroxy (Vit-D Deficiency, Fractures)    Standing Status:   Future    Standing Expiration Date:   02/18/2022   CBC with Differential/Platelet    Standing Status:   Future    Standing Expiration Date:   02/18/2022   Comprehensive metabolic panel    Standing Status:   Future    Standing Expiration Date:   02/18/2022   Urinalysis, Routine w reflex microscopic   TSH    Standing Status:   Future    Standing Expiration Date:   02/18/2022   Labs ordered for June/ July 2023.  No orders of the defined types were placed in this encounter.   Follow-up: Return in about 3 months (around 05/19/2021), or  if symptoms worsen or fail to improve, for at any time for any worsening symptoms, Go to Emergency room/ urgent care if worse.    Marcille Buffy, FNP

## 2021-02-19 LAB — URINE CULTURE
MICRO NUMBER:: 12880362
Result:: NO GROWTH
SPECIMEN QUALITY:: ADEQUATE

## 2021-02-19 LAB — VITAMIN D 1,25 DIHYDROXY
Vitamin D 1, 25 (OH)2 Total: 51 pg/mL (ref 18–72)
Vitamin D2 1, 25 (OH)2: <8 pg/mL
Vitamin D3 1, 25 (OH)2: 51 pg/mL

## 2021-02-19 NOTE — Progress Notes (Signed)
Vitamin D is within normal limits

## 2021-02-19 NOTE — Progress Notes (Signed)
No growth in urine culture.calcium was noted in urine, if any signs of kidney stones please follow up with office.

## 2021-02-20 ENCOUNTER — Encounter: Payer: Self-pay | Admitting: Adult Health

## 2021-02-21 ENCOUNTER — Encounter: Payer: Self-pay | Admitting: Adult Health

## 2021-02-21 DIAGNOSIS — R87610 Atypical squamous cells of undetermined significance on cytologic smear of cervix (ASC-US): Secondary | ICD-10-CM | POA: Insufficient documentation

## 2021-02-21 LAB — CYTOLOGY - PAP
Comment: NEGATIVE
Diagnosis: UNDETERMINED — AB
High risk HPV: NEGATIVE

## 2021-02-21 NOTE — Progress Notes (Signed)
Pap returned negative for HPV, transformation zone is present this means good sample obtained. Atypical squamous cells on result so will need to send her to gynecology to follow since abnormal, please let me know what her preference is for location/MD for OBGYN.

## 2021-03-05 ENCOUNTER — Ambulatory Visit (INDEPENDENT_AMBULATORY_CARE_PROVIDER_SITE_OTHER): Payer: BLUE CROSS/BLUE SHIELD | Admitting: Cardiovascular Disease

## 2021-03-05 ENCOUNTER — Ambulatory Visit
Admission: RE | Admit: 2021-03-05 | Discharge: 2021-03-05 | Disposition: A | Discharge status: Home / Self Care | Payer: BLUE CROSS/BLUE SHIELD | Source: Ambulatory Visit | Attending: Cardiovascular Disease | Admitting: Cardiovascular Disease

## 2021-03-05 ENCOUNTER — Encounter: Payer: Self-pay | Admitting: Cardiovascular Disease

## 2021-03-05 ENCOUNTER — Other Ambulatory Visit: Payer: Self-pay

## 2021-03-05 ENCOUNTER — Other Ambulatory Visit: Payer: Self-pay | Admitting: Adult Health

## 2021-03-05 VITALS — BP 110/70 | HR 67 | Ht 62.0 in | Wt 133.2 lb

## 2021-03-05 DIAGNOSIS — E782 Mixed hyperlipidemia: Secondary | ICD-10-CM

## 2021-03-05 DIAGNOSIS — Z01810 Encounter for preprocedural cardiovascular examination: Secondary | ICD-10-CM

## 2021-03-05 DIAGNOSIS — R079 Chest pain, unspecified: Secondary | ICD-10-CM | POA: Insufficient documentation

## 2021-03-05 DIAGNOSIS — Z803 Family history of malignant neoplasm of breast: Secondary | ICD-10-CM

## 2021-03-05 NOTE — Progress Notes (Signed)
Cardiology Office Note  Date:  03/05/2021   ID:  Jo Gibson, DOB 01-Jun-1985, MRN [id number]  PCP:  Jo Pap, FNP   Chief Complaint  Patient presents with   3 month follow up     Patient c/o a dull ache in chest yesterday with heart racing. Medications reviewed by the patient verbally.     HPI:   [name] is a 36 year old woman with past medical history of Hyperlipidemia Who presents for follow-up of her hyperlipidemia  Reports recent incident, one of her children ran into her chest Gibson since then has had periodic dull ache left upper chest, seems to happen more when she has stress or anxious  Very active at baseline, exercises, no chest pain on exertion  Discussion of prior cholesterol levels Total cholesterol LDL February 17, 2021 216   146 December 06, 2020 209   147 September 13, 2020 228   169 March 2022  193   134   EKG personally reviewed by myself on todays visit Normal sinus rhythm rate 67 bpm no significant ST-T wave changes  PMH:   has no past medical history on file.  PSH:   History reviewed. No pertinent surgical history.  Current Outpatient Medications  Medication Sig Dispense Refill   ASHWAGANDHA PO Take by mouth. (Patient not taking: Reported on 02/18/2021)     Cholecalciferol (VITAMIN D) 125 MCG (5000 UT) CAPS Take by mouth. (Patient not taking: Reported on 03/05/2021)     Omega-3 Fatty Acids (FISH OIL PO) Take by mouth. (Patient not taking: Reported on 03/05/2021)     No current facility-administered medications for this visit.    Allergies:   Patient has no known allergies.   Social History:  The patient  reports that she has never smoked. She has never used smokeless tobacco. She reports that she does not currently use alcohol. She reports that she does not use drugs.   Family History:   family history includes Cancer in her mother; Diabetes in her paternal grandfather; Epilepsy in her mother; Hyperlipidemia in her father and  maternal grandmother; Hypertension in her father and maternal grandmother; Liver cancer in her paternal grandmother; Thyroid disease in her maternal aunt.    Review of Systems: Review of Systems  Constitutional: Negative.   HENT: Negative.    Respiratory: Negative.    Cardiovascular:  Positive for chest pain.  Gastrointestinal: Negative.   Musculoskeletal: Negative.   Neurological: Negative.   Psychiatric/Behavioral: Negative.    All other systems reviewed and are negative.   PHYSICAL EXAM: VS:  BP 110/70 (BP Location: Left Arm, Patient Position: Sitting, Cuff Size: Normal)    Pulse 67    Ht 5\' 2"  (1.575 m)    Wt 133 lb 4 oz (60.4 kg)    SpO2 99%    BMI 24.37 kg/m  , BMI Body mass index is 24.37 kg/m. GEN: Well nourished, well developed, in no acute distress HEENT: normal Neck: no JVD, carotid bruits, or masses Cardiac: RRR; no murmurs, rubs, or gallops,no edema  Respiratory:  clear to auscultation bilaterally, normal work of breathing GI: soft, nontender, nondistended, + BS MS: no deformity or atrophy Skin: warm and dry, no rash Neuro:  Strength and sensation are intact Psych: euthymic mood, full affect  Recent Labs: 02/17/2021: ALT 10; BUN 15; Creatinine, Ser 0.62; Hemoglobin 12.9; Platelets 253.0; Potassium 4.4; Sodium 142; TSH 1.04    Lipid Panel Lab Results  Component Value Date   CHOL 216 (H) 02/17/2021  HDL 53.00 02/17/2021   LDLCALC 146 (H) 02/17/2021   TRIG 84.0 02/17/2021      Wt Readings from Last 3 Encounters:  03/05/21 133 lb 4 oz (60.4 kg)  02/18/21 133 lb 9.6 oz (60.6 kg)  10/04/20 134 lb (60.8 kg)     ASSESSMENT AND PLAN:  Problem List Items Addressed This Visit       Cardiology Problems   HLD (hyperlipidemia) - Primary   Relevant Orders   CT CARDIAC SCORING (SELF PAY ONLY)   Other Visit Diagnoses     Chest pain of uncertain etiology       Relevant Orders   EKG 12-Lead   CT CARDIAC SCORING (SELF PAY ONLY)   Pre-procedural  cardiovascular examination          Chest pain Atypical nature, low risk for underlying ischemia Good exercise tolerance, Discussed preventive care screening study, we have ordered CT coronary calcium scoring  Hyperlipidemia Numbers detailed above, spike in numbers some months ago, trending back down Has lost some recent weight No strong indication for genetic testing, discussed with her Recommended continued exercise, calcium scoring as above Further recommendation based off calcium scoring She prefers no medications at this time Discussed options for over-the-counter cholesterol management including plant sterols, red yeast rice    Total encounter time more than 30 minutes  Greater than 50% was spent in counseling and coordination of care with the patient    Signed, Dossie Arbour, M.D., Ph.D. [facility] [place], [place] [phone]

## 2021-03-05 NOTE — Patient Instructions (Addendum)
Medication Instructions:  No changes  If you need a refill on your cardiac medications before your next appointment, please call your pharmacy.    Lab work: No new labs needed  Testing/Procedures: We have order a CT coronary calcium score (you may see results on your MyChart account, but we will call via phone with the results) This is a $99 out of pocket expense   This procedure uses special x-ray equipment to produce pictures of the coronary arteries to determine if they are blocked or narrowed by the buildup of plaque - an indicator for atherosclerosis or coronary artery disease (CAD).  Please call [phone] to schedule at your earliest convince   This is done at our [facility] Location in [facility] [address] [place], [place] [address]  Follow-Up: At [facility], you and your health needs are our priority.  As part of our continuing mission to provide you with exceptional heart care, we have created designated Provider Care Teams.  These Care Teams include your primary Cardiologist (physician) and Advanced Practice Providers (APPs -  Physician Assistants and Nurse Practitioners) who all work together to provide you with the care you need, when you need it.  You will need a follow up appointment as needed  Providers on your designated Care Team:   Christopher Berge, NP Ryan Dunn, PA-C Cadence Furth, PA-C  COVID-19 Vaccine Information can be found at: [email or web address] For questions related to vaccine distribution or appointments, please email [email or web address] or call [phone].    

## 2021-03-21 DIAGNOSIS — M9903 Segmental and somatic dysfunction of lumbar region: Secondary | ICD-10-CM | POA: Diagnosis not present

## 2021-03-21 DIAGNOSIS — M546 Pain in thoracic spine: Secondary | ICD-10-CM | POA: Diagnosis not present

## 2021-03-21 DIAGNOSIS — M9902 Segmental and somatic dysfunction of thoracic region: Secondary | ICD-10-CM | POA: Diagnosis not present

## 2021-03-21 DIAGNOSIS — M5451 Vertebrogenic low back pain: Secondary | ICD-10-CM | POA: Diagnosis not present

## 2021-03-24 ENCOUNTER — Encounter: Payer: Self-pay | Admitting: Cardiology

## 2021-04-04 ENCOUNTER — Other Ambulatory Visit: Payer: Self-pay

## 2021-04-04 ENCOUNTER — Ambulatory Visit (INDEPENDENT_AMBULATORY_CARE_PROVIDER_SITE_OTHER): Payer: BLUE CROSS/BLUE SHIELD | Admitting: Obstetrics and Gynecology

## 2021-04-04 VITALS — BP 100/68 | Ht 62.0 in | Wt 135.0 lb

## 2021-04-04 DIAGNOSIS — R87610 Atypical squamous cells of undetermined significance on cytologic smear of cervix (ASC-US): Secondary | ICD-10-CM

## 2021-04-04 NOTE — Progress Notes (Signed)
Reviewed patient's history of Pap smears.  05/28/2020 Pap smear was normal with negative HPV.  02/18/2021 Pap smear showed ASCUS with with HPV negative testing.  Discussed that the patient does not need a colposcopy today.  Discussed that recommendation per [facility]P is for repeat Pap smear in 3 years. No bill for visit.  ? ?Adelene Idler MD, FACOG ?[facility], [facility] ?04/04/2021 ?9:46 AM ? ?

## 2021-07-08 ENCOUNTER — Encounter: Payer: Self-pay | Admitting: Family Medicine

## 2021-07-16 ENCOUNTER — Ambulatory Visit: Payer: BLUE CROSS/BLUE SHIELD | Admitting: Family Medicine

## 2021-07-30 DIAGNOSIS — M5451 Vertebrogenic low back pain: Secondary | ICD-10-CM | POA: Diagnosis not present

## 2021-07-30 DIAGNOSIS — M9902 Segmental and somatic dysfunction of thoracic region: Secondary | ICD-10-CM | POA: Diagnosis not present

## 2021-07-30 DIAGNOSIS — M9903 Segmental and somatic dysfunction of lumbar region: Secondary | ICD-10-CM | POA: Diagnosis not present

## 2021-07-30 DIAGNOSIS — M546 Pain in thoracic spine: Secondary | ICD-10-CM | POA: Diagnosis not present

## 2021-09-02 DIAGNOSIS — H5203 Hypermetropia, bilateral: Secondary | ICD-10-CM | POA: Diagnosis not present

## 2021-09-15 ENCOUNTER — Ambulatory Visit
Admission: RE | Admit: 2021-09-15 | Discharge: 2021-09-15 | Disposition: A | Discharge status: Home / Self Care | Payer: BLUE CROSS/BLUE SHIELD | Source: Ambulatory Visit | Attending: Adult Health | Admitting: Adult Health

## 2021-09-15 DIAGNOSIS — Z803 Family history of malignant neoplasm of breast: Secondary | ICD-10-CM

## 2021-09-15 DIAGNOSIS — Z1231 Encounter for screening mammogram for malignant neoplasm of breast: Secondary | ICD-10-CM | POA: Diagnosis not present

## 2021-09-16 ENCOUNTER — Other Ambulatory Visit: Payer: Self-pay | Admitting: Family

## 2021-09-16 ENCOUNTER — Encounter: Payer: Self-pay | Admitting: Family

## 2021-09-16 DIAGNOSIS — R928 Other abnormal and inconclusive findings on diagnostic imaging of breast: Secondary | ICD-10-CM

## 2021-09-16 NOTE — Telephone Encounter (Signed)
The patient is requesting a call back from a provider to discuss her mammogram results.

## 2021-10-09 ENCOUNTER — Ambulatory Visit
Admission: RE | Admit: 2021-10-09 | Discharge: 2021-10-09 | Disposition: A | Discharge status: Home / Self Care | Payer: BLUE CROSS/BLUE SHIELD | Source: Ambulatory Visit | Attending: Family | Admitting: Family

## 2021-10-09 DIAGNOSIS — R928 Other abnormal and inconclusive findings on diagnostic imaging of breast: Secondary | ICD-10-CM

## 2021-10-09 DIAGNOSIS — Z803 Family history of malignant neoplasm of breast: Secondary | ICD-10-CM | POA: Diagnosis not present

## 2021-10-09 DIAGNOSIS — N6489 Other specified disorders of breast: Secondary | ICD-10-CM | POA: Diagnosis not present

## 2021-10-10 ENCOUNTER — Encounter: Payer: Self-pay | Admitting: Family

## 2021-10-10 ENCOUNTER — Other Ambulatory Visit: Payer: Self-pay | Admitting: Family

## 2021-10-10 DIAGNOSIS — Z803 Family history of malignant neoplasm of breast: Secondary | ICD-10-CM

## 2021-10-14 DIAGNOSIS — M542 Cervicalgia: Secondary | ICD-10-CM | POA: Diagnosis not present

## 2021-10-14 DIAGNOSIS — M9903 Segmental and somatic dysfunction of lumbar region: Secondary | ICD-10-CM | POA: Diagnosis not present

## 2021-10-14 DIAGNOSIS — M9902 Segmental and somatic dysfunction of thoracic region: Secondary | ICD-10-CM | POA: Diagnosis not present

## 2021-10-14 DIAGNOSIS — M9901 Segmental and somatic dysfunction of cervical region: Secondary | ICD-10-CM | POA: Diagnosis not present

## 2021-10-17 DIAGNOSIS — M542 Cervicalgia: Secondary | ICD-10-CM | POA: Diagnosis not present

## 2021-10-17 DIAGNOSIS — M9903 Segmental and somatic dysfunction of lumbar region: Secondary | ICD-10-CM | POA: Diagnosis not present

## 2021-10-17 DIAGNOSIS — M9901 Segmental and somatic dysfunction of cervical region: Secondary | ICD-10-CM | POA: Diagnosis not present

## 2021-10-17 DIAGNOSIS — M9902 Segmental and somatic dysfunction of thoracic region: Secondary | ICD-10-CM | POA: Diagnosis not present

## 2021-10-20 DIAGNOSIS — M542 Cervicalgia: Secondary | ICD-10-CM | POA: Diagnosis not present

## 2021-10-20 DIAGNOSIS — M9901 Segmental and somatic dysfunction of cervical region: Secondary | ICD-10-CM | POA: Diagnosis not present

## 2021-10-20 DIAGNOSIS — M9902 Segmental and somatic dysfunction of thoracic region: Secondary | ICD-10-CM | POA: Diagnosis not present

## 2021-10-20 DIAGNOSIS — M9903 Segmental and somatic dysfunction of lumbar region: Secondary | ICD-10-CM | POA: Diagnosis not present

## 2021-10-21 ENCOUNTER — Encounter: Payer: Self-pay | Admitting: Family

## 2021-10-21 DIAGNOSIS — M9903 Segmental and somatic dysfunction of lumbar region: Secondary | ICD-10-CM | POA: Diagnosis not present

## 2021-10-21 DIAGNOSIS — M542 Cervicalgia: Secondary | ICD-10-CM | POA: Diagnosis not present

## 2021-10-21 DIAGNOSIS — M9902 Segmental and somatic dysfunction of thoracic region: Secondary | ICD-10-CM | POA: Diagnosis not present

## 2021-10-21 DIAGNOSIS — M9901 Segmental and somatic dysfunction of cervical region: Secondary | ICD-10-CM | POA: Diagnosis not present

## 2021-10-22 ENCOUNTER — Telehealth: Payer: Self-pay | Admitting: *Deleted

## 2021-10-22 ENCOUNTER — Encounter: Payer: Self-pay | Admitting: Family

## 2021-10-22 ENCOUNTER — Other Ambulatory Visit: Payer: Self-pay

## 2021-10-22 ENCOUNTER — Ambulatory Visit (INDEPENDENT_AMBULATORY_CARE_PROVIDER_SITE_OTHER): Payer: BLUE CROSS/BLUE SHIELD | Admitting: Family

## 2021-10-22 DIAGNOSIS — E785 Hyperlipidemia, unspecified: Secondary | ICD-10-CM

## 2021-10-22 DIAGNOSIS — Z0000 Encounter for general adult medical examination without abnormal findings: Secondary | ICD-10-CM | POA: Diagnosis not present

## 2021-10-22 DIAGNOSIS — R5383 Other fatigue: Secondary | ICD-10-CM

## 2021-10-22 DIAGNOSIS — E673 Hypervitaminosis D: Secondary | ICD-10-CM

## 2021-10-22 DIAGNOSIS — E559 Vitamin D deficiency, unspecified: Secondary | ICD-10-CM

## 2021-10-22 LAB — COMPREHENSIVE METABOLIC PANEL
ALT: 8 U/L (ref 0–35)
AST: 18 U/L (ref 0–37)
Albumin: 4.2 g/dL (ref 3.5–5.2)
Alkaline Phosphatase: 40 U/L (ref 39–117)
BUN: 15 mg/dL (ref 6–23)
CO2: 30 mEq/L (ref 19–32)
Calcium: 9.7 mg/dL (ref 8.4–10.5)
Chloride: 102 mEq/L (ref 96–112)
Creatinine, Ser: 0.68 mg/dL (ref 0.40–1.20)
GFR: 111.89 mL/min (ref >60.00)
Glucose, Bld: 92 mg/dL (ref 70–99)
Potassium: 4.4 mEq/L (ref 3.5–5.1)
Sodium: 139 mEq/L (ref 135–145)
Total Bilirubin: 0.5 mg/dL (ref 0.2–1.2)
Total Protein: 6.7 g/dL (ref 6.0–8.3)

## 2021-10-22 LAB — CBC WITH DIFFERENTIAL/PLATELET
Basophils Absolute: 0 10*3/uL (ref 0.0–0.1)
Basophils Relative: 0.5 % (ref 0.0–3.0)
Eosinophils Absolute: 0.1 10*3/uL (ref 0.0–0.7)
Eosinophils Relative: 1 % (ref 0.0–5.0)
HCT: 37.6 % (ref 36.0–46.0)
Hemoglobin: 12.8 g/dL (ref 12.0–15.0)
Lymphocytes Relative: 23.8 % (ref 12.0–46.0)
Lymphs Abs: 2.1 10*3/uL (ref 0.7–4.0)
MCHC: 34 g/dL (ref 30.0–36.0)
MCV: 85.7 fl (ref 78.0–100.0)
Monocytes Absolute: 0.5 10*3/uL (ref 0.1–1.0)
Monocytes Relative: 5.5 % (ref 3.0–12.0)
Neutro Abs: 6.1 10*3/uL (ref 1.4–7.7)
Neutrophils Relative %: 69.2 % (ref 43.0–77.0)
Platelets: 280 10*3/uL (ref 150.0–400.0)
RBC: 4.39 Mil/uL (ref 3.87–5.11)
RDW: 12.7 % (ref 11.5–15.5)
WBC: 8.8 10*3/uL (ref 4.0–10.5)

## 2021-10-22 LAB — LIPID PANEL
Cholesterol: 206 mg/dL — ABNORMAL HIGH (ref 0–200)
HDL: 52.2 mg/dL (ref >39.00)
LDL Cholesterol: 131 mg/dL — ABNORMAL HIGH (ref 0–99)
NonHDL: 153.38
Total CHOL/HDL Ratio: 4
Triglycerides: 111 mg/dL (ref 0.0–149.0)
VLDL: 22.2 mg/dL (ref 0.0–40.0)

## 2021-10-22 LAB — VITAMIN D 25 HYDROXY (VIT D DEFICIENCY, FRACTURES): VITD: 101.29 ng/mL (ref 30.00–100.00)

## 2021-10-22 LAB — HEMOGLOBIN A1C: Hgb A1c MFr Bld: 4.9 % (ref 4.6–6.5)

## 2021-10-22 LAB — TSH: TSH: 0.76 u[IU]/mL (ref 0.35–5.50)

## 2021-10-22 NOTE — Progress Notes (Signed)
Lab orders

## 2021-10-22 NOTE — Assessment & Plan Note (Addendum)
Reviewed cardiology consult, CT calcium score with a score of 0.  We will continue to monitor lipid panel closely.  Patient will continue dietary and lifestyle modifications.  Of note: Past medical ,family and surgical history reviewed with patient today

## 2021-10-22 NOTE — Progress Notes (Signed)
Subjective:    Patient ID: Jo Gibson, female    DOB: 11-Sep-1985, 36 y.o.   MRN: [id number]  CC: Jo Gibson is a 36 y.o. female who presents today for establish care, transfer of care  HPI: Feels well today.  No new concerns.  She continues to follow healthy diet .  She is busy with 3 young children .  she is very conscientious regarding history of elevated cholesterol.  She would like blood work done today.   Mother has history of breast cancer Breast MRI ordered, due 03/2022 Cardiology consult 03/05/2021 Jo Gibson for chest pain, hyperlipidemia.  CT calcium score of 0.   HISTORY:  History reviewed. No pertinent past medical history. Past Surgical History:  Procedure Laterality Date   TONSILECTOMY, ADENOIDECTOMY, BILATERAL MYRINGOTOMY AND TUBES     when 36 yo   Family History  Problem Relation Age of Onset   Breast cancer Mother 15   Epilepsy Mother        benign brain tumor   Hypertension Father    Hyperlipidemia Father    Hypertension Maternal Grandmother    Hyperlipidemia Maternal Grandmother    Liver cancer Paternal Grandmother        alcohol   Diabetes Paternal Grandfather        type 2   Thyroid disease Maternal Aunt    Cancer - Colon Neg Hx     Allergies: Patient has no known allergies. No current outpatient medications on file prior to visit.   No current facility-administered medications on file prior to visit.    Social History   Tobacco Use   Smoking status: Never   Smokeless tobacco: Never  Vaping Use   Vaping Use: Never used  Substance Use Topics   Alcohol use: Not Currently   Drug use: Never    Review of Systems  Constitutional:  Negative for chills and fever.  Respiratory:  Negative for cough.   Cardiovascular:  Negative for chest pain and palpitations.  Gastrointestinal:  Negative for nausea and vomiting.      Objective:    BP 128/68 (BP Location: Left Arm, Patient Position: Sitting, Cuff Size: Normal)   Pulse 76   Temp  98.3 F (36.8 C) (Oral)   Ht 5' 2.5" (1.588 m)   Wt 140 lb 6.4 oz (63.7 kg)   LMP 09/17/2021   SpO2 99%   BMI 25.27 kg/m  BP Readings from Last 3 Encounters:  10/22/21 128/68  04/04/21 100/68  03/05/21 110/70   Wt Readings from Last 3 Encounters:  10/22/21 140 lb 6.4 oz (63.7 kg)  04/04/21 135 lb (61.2 kg)  03/05/21 133 lb 4 oz (60.4 kg)    Physical Exam Vitals reviewed.  Constitutional:      Appearance: She is well-developed.  Eyes:     Conjunctiva/sclera: Conjunctivae normal.  Cardiovascular:     Rate and Rhythm: Normal rate and regular rhythm.     Pulses: Normal pulses.     Heart sounds: Normal heart sounds.  Pulmonary:     Effort: Pulmonary effort is normal.     Breath sounds: Normal breath sounds. No wheezing, rhonchi or rales.  Skin:    General: Skin is warm and dry.  Neurological:     Mental Status: She is alert.  Psychiatric:        Speech: Speech normal.        Behavior: Behavior normal.        Thought Content: Thought content normal.  Assessment & Plan:   Problem List Items Addressed This Visit       Other   HLD (hyperlipidemia)    Reviewed cardiology consult, CT calcium score with a score of 0.  We will continue to monitor lipid panel closely.  Patient will continue dietary and lifestyle modifications.  Of note: Past medical ,family and surgical history reviewed with patient today      Routine adult health maintenance   Other Visit Diagnoses     Fatigue, unspecified type            Jo Gibson does not currently have medications on file.   No orders of the defined types were placed in this encounter.   Return precautions given.   Risks, benefits, and alternatives of the medications and treatment plan prescribed today were discussed, and patient expressed understanding.   Education regarding symptom management and diagnosis given to patient on AVS.  Continue to follow with Jo Grana, Jo Gibson for routine health  maintenance.   Jo Gibson and I agreed with plan.   Jo Plowman, Jo Gibson

## 2021-10-22 NOTE — Telephone Encounter (Signed)
Spoke to patient today ather office visit and she will be getting labs done tomorrow on 10/23/21

## 2021-10-22 NOTE — Telephone Encounter (Signed)
Call pt   Vit D is very high  Please ask pt to stop of ALL forms of vitamin D  Please order Vit D and sch in 4 weeks

## 2021-10-22 NOTE — Telephone Encounter (Signed)
Called patient and informed her that her Vit D was extremely high and to stop ALL Vit D  until we do a recheck  of her vit d in 4 weeks! Patient verbalized understanding and schedule lab apt for 4 weeks out for recheck of vit D

## 2021-10-22 NOTE — Telephone Encounter (Signed)
CRITICAL VALUE STICKER  CRITICAL VALUE: Vitamin D (H)-101.29  RECEIVER (on-site recipient of call): Jari Favre, CMA/XT  DATE & TIME NOTIFIED: 10/22/21 @ 4:05pm  MESSENGER (representative from lab): Flonnie Overman  MD NOTIFIED: Vidal Schwalbe  TIME OF NOTIFICATION:4:06pm  RESPONSE:

## 2021-10-30 DIAGNOSIS — M9903 Segmental and somatic dysfunction of lumbar region: Secondary | ICD-10-CM | POA: Diagnosis not present

## 2021-10-30 DIAGNOSIS — M542 Cervicalgia: Secondary | ICD-10-CM | POA: Diagnosis not present

## 2021-10-30 DIAGNOSIS — M9901 Segmental and somatic dysfunction of cervical region: Secondary | ICD-10-CM | POA: Diagnosis not present

## 2021-10-30 DIAGNOSIS — M9902 Segmental and somatic dysfunction of thoracic region: Secondary | ICD-10-CM | POA: Diagnosis not present

## 2021-11-03 DIAGNOSIS — M9902 Segmental and somatic dysfunction of thoracic region: Secondary | ICD-10-CM | POA: Diagnosis not present

## 2021-11-03 DIAGNOSIS — M542 Cervicalgia: Secondary | ICD-10-CM | POA: Diagnosis not present

## 2021-11-03 DIAGNOSIS — M9901 Segmental and somatic dysfunction of cervical region: Secondary | ICD-10-CM | POA: Diagnosis not present

## 2021-11-03 DIAGNOSIS — M9903 Segmental and somatic dysfunction of lumbar region: Secondary | ICD-10-CM | POA: Diagnosis not present

## 2021-11-04 DIAGNOSIS — M9902 Segmental and somatic dysfunction of thoracic region: Secondary | ICD-10-CM | POA: Diagnosis not present

## 2021-11-04 DIAGNOSIS — M9901 Segmental and somatic dysfunction of cervical region: Secondary | ICD-10-CM | POA: Diagnosis not present

## 2021-11-04 DIAGNOSIS — M9903 Segmental and somatic dysfunction of lumbar region: Secondary | ICD-10-CM | POA: Diagnosis not present

## 2021-11-04 DIAGNOSIS — M542 Cervicalgia: Secondary | ICD-10-CM | POA: Diagnosis not present

## 2021-11-12 ENCOUNTER — Other Ambulatory Visit (INDEPENDENT_AMBULATORY_CARE_PROVIDER_SITE_OTHER): Payer: BLUE CROSS/BLUE SHIELD

## 2021-11-12 DIAGNOSIS — E673 Hypervitaminosis D: Secondary | ICD-10-CM

## 2021-11-12 LAB — VITAMIN D 25 HYDROXY (VIT D DEFICIENCY, FRACTURES): VITD: 91.28 ng/mL (ref 30.00–100.00)

## 2021-12-10 DIAGNOSIS — M9902 Segmental and somatic dysfunction of thoracic region: Secondary | ICD-10-CM | POA: Diagnosis not present

## 2021-12-10 DIAGNOSIS — M542 Cervicalgia: Secondary | ICD-10-CM | POA: Diagnosis not present

## 2021-12-10 DIAGNOSIS — M9903 Segmental and somatic dysfunction of lumbar region: Secondary | ICD-10-CM | POA: Diagnosis not present

## 2021-12-10 DIAGNOSIS — M9901 Segmental and somatic dysfunction of cervical region: Secondary | ICD-10-CM | POA: Diagnosis not present

## 2022-01-16 DIAGNOSIS — M9903 Segmental and somatic dysfunction of lumbar region: Secondary | ICD-10-CM | POA: Diagnosis not present

## 2022-01-16 DIAGNOSIS — M9901 Segmental and somatic dysfunction of cervical region: Secondary | ICD-10-CM | POA: Diagnosis not present

## 2022-01-16 DIAGNOSIS — M9902 Segmental and somatic dysfunction of thoracic region: Secondary | ICD-10-CM | POA: Diagnosis not present

## 2022-01-16 DIAGNOSIS — M542 Cervicalgia: Secondary | ICD-10-CM | POA: Diagnosis not present

## 2022-01-19 ENCOUNTER — Encounter: Payer: Self-pay | Admitting: Family

## 2022-01-19 DIAGNOSIS — M9903 Segmental and somatic dysfunction of lumbar region: Secondary | ICD-10-CM | POA: Diagnosis not present

## 2022-01-19 DIAGNOSIS — M9901 Segmental and somatic dysfunction of cervical region: Secondary | ICD-10-CM | POA: Diagnosis not present

## 2022-01-19 DIAGNOSIS — M542 Cervicalgia: Secondary | ICD-10-CM | POA: Diagnosis not present

## 2022-01-19 DIAGNOSIS — M9902 Segmental and somatic dysfunction of thoracic region: Secondary | ICD-10-CM | POA: Diagnosis not present

## 2022-01-21 ENCOUNTER — Encounter: Payer: Self-pay | Admitting: Family

## 2022-01-30 ENCOUNTER — Other Ambulatory Visit: Payer: Self-pay | Admitting: Family

## 2022-01-30 DIAGNOSIS — Z800 Family history of malignant neoplasm of digestive organs: Secondary | ICD-10-CM

## 2022-03-11 ENCOUNTER — Other Ambulatory Visit: Payer: Self-pay | Admitting: Family

## 2022-03-11 DIAGNOSIS — Z1231 Encounter for screening mammogram for malignant neoplasm of breast: Secondary | ICD-10-CM

## 2022-03-12 ENCOUNTER — Ambulatory Visit: Admission: RE | Admit: 2022-03-12 | Payer: BLUE CROSS/BLUE SHIELD | Source: Ambulatory Visit

## 2022-03-19 DIAGNOSIS — M9903 Segmental and somatic dysfunction of lumbar region: Secondary | ICD-10-CM | POA: Diagnosis not present

## 2022-03-19 DIAGNOSIS — M9902 Segmental and somatic dysfunction of thoracic region: Secondary | ICD-10-CM | POA: Diagnosis not present

## 2022-03-19 DIAGNOSIS — M542 Cervicalgia: Secondary | ICD-10-CM | POA: Diagnosis not present

## 2022-03-19 DIAGNOSIS — M9901 Segmental and somatic dysfunction of cervical region: Secondary | ICD-10-CM | POA: Diagnosis not present

## 2022-04-06 ENCOUNTER — Encounter: Payer: Self-pay | Admitting: Family

## 2022-04-10 DIAGNOSIS — M7542 Impingement syndrome of left shoulder: Secondary | ICD-10-CM | POA: Diagnosis not present

## 2022-05-06 ENCOUNTER — Encounter: Payer: Self-pay | Admitting: Family

## 2022-05-11 DIAGNOSIS — M9901 Segmental and somatic dysfunction of cervical region: Secondary | ICD-10-CM | POA: Diagnosis not present

## 2022-05-11 DIAGNOSIS — M9903 Segmental and somatic dysfunction of lumbar region: Secondary | ICD-10-CM | POA: Diagnosis not present

## 2022-05-11 DIAGNOSIS — M5459 Other low back pain: Secondary | ICD-10-CM | POA: Diagnosis not present

## 2022-05-11 DIAGNOSIS — M542 Cervicalgia: Secondary | ICD-10-CM | POA: Diagnosis not present

## 2022-05-18 DIAGNOSIS — M9903 Segmental and somatic dysfunction of lumbar region: Secondary | ICD-10-CM | POA: Diagnosis not present

## 2022-05-18 DIAGNOSIS — M5459 Other low back pain: Secondary | ICD-10-CM | POA: Diagnosis not present

## 2022-05-18 DIAGNOSIS — M9901 Segmental and somatic dysfunction of cervical region: Secondary | ICD-10-CM | POA: Diagnosis not present

## 2022-05-18 DIAGNOSIS — M542 Cervicalgia: Secondary | ICD-10-CM | POA: Diagnosis not present

## 2022-06-03 ENCOUNTER — Other Ambulatory Visit: Payer: Self-pay | Admitting: Family

## 2022-06-03 ENCOUNTER — Encounter: Payer: Self-pay | Admitting: Family

## 2022-06-03 DIAGNOSIS — T452X4S Poisoning by vitamins, undetermined, sequela: Secondary | ICD-10-CM

## 2022-06-03 NOTE — Telephone Encounter (Signed)
Spoke to pt and she stated that she declines to come into office she just needs referral, per pt yall had discussed before and she asked for bloodwork for endocrine and you stated that you did not do that.

## 2022-06-04 NOTE — Telephone Encounter (Signed)
Spoke to pt and she is aware. 

## 2022-07-01 DIAGNOSIS — M9906 Segmental and somatic dysfunction of lower extremity: Secondary | ICD-10-CM | POA: Diagnosis not present

## 2022-07-01 DIAGNOSIS — M5459 Other low back pain: Secondary | ICD-10-CM | POA: Diagnosis not present

## 2022-07-01 DIAGNOSIS — M25452 Effusion, left hip: Secondary | ICD-10-CM | POA: Diagnosis not present

## 2022-07-01 DIAGNOSIS — M9903 Segmental and somatic dysfunction of lumbar region: Secondary | ICD-10-CM | POA: Diagnosis not present

## 2022-07-02 ENCOUNTER — Encounter: Payer: Self-pay | Admitting: Podiatry

## 2022-07-02 ENCOUNTER — Other Ambulatory Visit: Payer: Self-pay

## 2022-07-02 ENCOUNTER — Ambulatory Visit (INDEPENDENT_AMBULATORY_CARE_PROVIDER_SITE_OTHER): Payer: BLUE CROSS/BLUE SHIELD | Admitting: Podiatry

## 2022-07-02 VITALS — BP 112/71 | HR 61

## 2022-07-02 DIAGNOSIS — L603 Nail dystrophy: Secondary | ICD-10-CM

## 2022-07-02 MED ORDER — CLOTRIMAZOLE-BETAMETHASONE 1-0.05 % EX CREA: 1.0000 | TOPICAL_CREAM | Freq: Every day | CUTANEOUS | 0 refills | Status: AC

## 2022-07-02 NOTE — Progress Notes (Signed)
  Subjective:  Patient ID: Jo Gibson, female    DOB: 06-Aug-1985,  MRN: [id number]  Chief Complaint  Patient presents with   Nail Problem    "My toenails on my right foot, the fungus has come back.  I took medicine before and it cleared up, I had to have my liver checked while taking it.  It's even on the bottom of my foot now too.  The nail is not attached on the big toe."    37 y.o. female presents with the above complaint.  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  Patient states she has failed multiple fungal treatment options.  She would like to have it removed.  She would also like to make it permanent.  She denies any other acute complaints.   Review of Systems: Negative except as noted in the HPI. Denies N/V/F/Ch.  No past medical history on file. No current outpatient medications on file.  Social History   Tobacco Use  Smoking Status Never  Smokeless Tobacco Never    No Known Allergies Objective:   Vitals:   07/02/22 1021  BP: 112/71  Pulse: 61   There is no height or weight on file to calculate BMI. Constitutional Well developed. Well nourished.  Vascular Dorsalis pedis pulses palpable bilaterally. Posterior tibial pulses palpable bilaterally. Capillary refill normal to all digits.  No cyanosis or clubbing noted. Pedal hair growth normal.  Neurologic Normal speech. Oriented to person, place, and time. Epicritic sensation to light touch grossly present bilaterally.  Dermatologic Pain on palpation of the entire/total nail on 1st digit of the right No other open wounds. No skin lesions.  Orthopedic: Normal joint ROM without pain or crepitus bilaterally. No visible deformities. No bony tenderness.   Radiographs: None Assessment:   1. Nail dystrophy    Plan:  Patient was evaluated and treated and all questions answered.  Nail contusion/dystrophy hallux, right -Patient elects to proceed with minor  surgery to remove entire toenail today. Consent reviewed and signed by patient. -Entire/total nail excised. See procedure note. -Educated on post-procedure care including soaking. Written instructions provided and reviewed. -Patient to follow up in 2 weeks for nail check.  Procedure: Excision of entire/total nail  Location: Right 1st toe digit Anesthesia: Lidocaine 1% plain; 1.5 mL and Marcaine 0.5% plain; 1.5 mL, digital block. Skin Prep: Betadine. Dressing: Silvadene; telfa; dry, sterile, compression dressing. Technique: Following skin prep, the toe was exsanguinated and a tourniquet was secured at the base of the toe. The affected nail border was freed and excised. The tourniquet was then removed and sterile dressing applied. Disposition: Patient tolerated procedure well. Patient to return in 2 weeks for follow-up.   No follow-ups on file.

## 2022-07-02 NOTE — Addendum Note (Signed)
Addended by: Nicholes Rough on: 07/02/2022 10:57 AM   Modules accepted: Orders

## 2022-07-13 DIAGNOSIS — M9903 Segmental and somatic dysfunction of lumbar region: Secondary | ICD-10-CM | POA: Diagnosis not present

## 2022-07-13 DIAGNOSIS — M25452 Effusion, left hip: Secondary | ICD-10-CM | POA: Diagnosis not present

## 2022-07-13 DIAGNOSIS — M9906 Segmental and somatic dysfunction of lower extremity: Secondary | ICD-10-CM | POA: Diagnosis not present

## 2022-07-13 DIAGNOSIS — M5459 Other low back pain: Secondary | ICD-10-CM | POA: Diagnosis not present

## 2022-07-24 DIAGNOSIS — M5459 Other low back pain: Secondary | ICD-10-CM | POA: Diagnosis not present

## 2022-07-24 DIAGNOSIS — M9903 Segmental and somatic dysfunction of lumbar region: Secondary | ICD-10-CM | POA: Diagnosis not present

## 2022-07-24 DIAGNOSIS — M25452 Effusion, left hip: Secondary | ICD-10-CM | POA: Diagnosis not present

## 2022-07-24 DIAGNOSIS — M9906 Segmental and somatic dysfunction of lower extremity: Secondary | ICD-10-CM | POA: Diagnosis not present

## 2022-08-07 ENCOUNTER — Encounter: Payer: Self-pay | Admitting: Family

## 2022-08-07 DIAGNOSIS — E559 Vitamin D deficiency, unspecified: Secondary | ICD-10-CM

## 2022-08-07 DIAGNOSIS — E785 Hyperlipidemia, unspecified: Secondary | ICD-10-CM

## 2022-08-07 NOTE — Telephone Encounter (Signed)
I have pended lab orders for your approval

## 2022-08-24 ENCOUNTER — Other Ambulatory Visit: Payer: Self-pay

## 2022-08-24 DIAGNOSIS — E785 Hyperlipidemia, unspecified: Secondary | ICD-10-CM

## 2022-08-24 DIAGNOSIS — E559 Vitamin D deficiency, unspecified: Secondary | ICD-10-CM

## 2022-08-24 NOTE — Telephone Encounter (Signed)
Spoke to pt and scheduled lab appt for 08/25/22

## 2022-08-25 ENCOUNTER — Encounter: Payer: Self-pay | Admitting: Family

## 2022-08-25 ENCOUNTER — Other Ambulatory Visit (INDEPENDENT_AMBULATORY_CARE_PROVIDER_SITE_OTHER): Payer: BLUE CROSS/BLUE SHIELD

## 2022-08-25 ENCOUNTER — Other Ambulatory Visit: Payer: BLUE CROSS/BLUE SHIELD

## 2022-08-25 DIAGNOSIS — E785 Hyperlipidemia, unspecified: Secondary | ICD-10-CM | POA: Diagnosis not present

## 2022-08-25 DIAGNOSIS — E559 Vitamin D deficiency, unspecified: Secondary | ICD-10-CM | POA: Diagnosis not present

## 2022-08-25 LAB — COMPREHENSIVE METABOLIC PANEL
ALT: 15 U/L (ref 0–35)
AST: 19 U/L (ref 0–37)
Albumin: 4.6 g/dL (ref 3.5–5.2)
Alkaline Phosphatase: 53 U/L (ref 39–117)
BUN: 14 mg/dL (ref 6–23)
CO2: 26 mEq/L (ref 19–32)
Calcium: 9.5 mg/dL (ref 8.4–10.5)
Chloride: 106 mEq/L (ref 96–112)
Creatinine, Ser: 0.58 mg/dL (ref 0.40–1.20)
GFR: 115.58 mL/min (ref >60.00)
Glucose, Bld: 89 mg/dL (ref 70–99)
Potassium: 4.7 mEq/L (ref 3.5–5.1)
Sodium: 140 mEq/L (ref 135–145)
Total Bilirubin: 0.7 mg/dL (ref 0.2–1.2)
Total Protein: 6.9 g/dL (ref 6.0–8.3)

## 2022-08-25 LAB — LIPID PANEL
Cholesterol: 251 mg/dL — ABNORMAL HIGH (ref 0–200)
HDL: 44.4 mg/dL (ref >39.00)
LDL Cholesterol: 182 mg/dL — ABNORMAL HIGH (ref 0–99)
NonHDL: 206.8
Total CHOL/HDL Ratio: 6
Triglycerides: 122 mg/dL (ref 0.0–149.0)
VLDL: 24.4 mg/dL (ref 0.0–40.0)

## 2022-08-25 LAB — LDL CHOLESTEROL, DIRECT: Direct LDL: 183 mg/dL

## 2022-08-25 LAB — VITAMIN D 25 HYDROXY (VIT D DEFICIENCY, FRACTURES): VITD: 50.95 ng/mL (ref 30.00–100.00)

## 2022-08-25 NOTE — Telephone Encounter (Signed)
Noted  

## 2022-09-01 ENCOUNTER — Other Ambulatory Visit: Payer: Self-pay | Admitting: Family

## 2022-09-02 NOTE — Telephone Encounter (Signed)
SPOKE  to pt and she does not want to come in for OV she scheduled appt for early Nov for other labs

## 2022-09-16 DIAGNOSIS — M5459 Other low back pain: Secondary | ICD-10-CM | POA: Diagnosis not present

## 2022-09-16 DIAGNOSIS — M25452 Effusion, left hip: Secondary | ICD-10-CM | POA: Diagnosis not present

## 2022-09-16 DIAGNOSIS — M9903 Segmental and somatic dysfunction of lumbar region: Secondary | ICD-10-CM | POA: Diagnosis not present

## 2022-09-16 DIAGNOSIS — M9906 Segmental and somatic dysfunction of lower extremity: Secondary | ICD-10-CM | POA: Diagnosis not present

## 2022-09-17 ENCOUNTER — Ambulatory Visit
Admission: RE | Admit: 2022-09-17 | Discharge: 2022-09-17 | Disposition: A | Discharge status: Home / Self Care | Payer: BLUE CROSS/BLUE SHIELD | Source: Ambulatory Visit | Attending: Family | Admitting: Family

## 2022-09-17 DIAGNOSIS — Z1231 Encounter for screening mammogram for malignant neoplasm of breast: Secondary | ICD-10-CM | POA: Insufficient documentation

## 2022-10-07 ENCOUNTER — Encounter: Payer: Self-pay | Admitting: Podiatry

## 2022-10-13 DIAGNOSIS — M9901 Segmental and somatic dysfunction of cervical region: Secondary | ICD-10-CM | POA: Diagnosis not present

## 2022-10-13 DIAGNOSIS — M26602 Left temporomandibular joint disorder, unspecified: Secondary | ICD-10-CM | POA: Diagnosis not present

## 2022-10-13 DIAGNOSIS — M9903 Segmental and somatic dysfunction of lumbar region: Secondary | ICD-10-CM | POA: Diagnosis not present

## 2022-10-13 DIAGNOSIS — M5459 Other low back pain: Secondary | ICD-10-CM | POA: Diagnosis not present

## 2022-10-20 ENCOUNTER — Ambulatory Visit: Payer: BLUE CROSS/BLUE SHIELD | Admitting: Podiatry

## 2022-10-29 DIAGNOSIS — M7542 Impingement syndrome of left shoulder: Secondary | ICD-10-CM | POA: Diagnosis not present

## 2022-11-09 ENCOUNTER — Encounter: Payer: Self-pay | Admitting: Family

## 2022-11-10 ENCOUNTER — Encounter: Payer: Self-pay | Admitting: Nurse Practitioner

## 2022-11-10 NOTE — Telephone Encounter (Signed)
Noted appt has been cancelled.

## 2022-11-20 DIAGNOSIS — M9901 Segmental and somatic dysfunction of cervical region: Secondary | ICD-10-CM | POA: Diagnosis not present

## 2022-11-20 DIAGNOSIS — M26602 Left temporomandibular joint disorder, unspecified: Secondary | ICD-10-CM | POA: Diagnosis not present

## 2022-11-20 DIAGNOSIS — M5459 Other low back pain: Secondary | ICD-10-CM | POA: Diagnosis not present

## 2022-11-20 DIAGNOSIS — M9903 Segmental and somatic dysfunction of lumbar region: Secondary | ICD-10-CM | POA: Diagnosis not present

## 2022-12-04 ENCOUNTER — Ambulatory Visit: Payer: BLUE CROSS/BLUE SHIELD | Admitting: Family

## 2022-12-22 ENCOUNTER — Encounter: Payer: Self-pay | Admitting: Nurse Practitioner

## 2023-01-07 ENCOUNTER — Ambulatory Visit: Payer: BLUE CROSS/BLUE SHIELD | Admitting: Nurse Practitioner

## 2023-01-18 DIAGNOSIS — M542 Cervicalgia: Secondary | ICD-10-CM | POA: Diagnosis not present

## 2023-01-18 DIAGNOSIS — K219 Gastro-esophageal reflux disease without esophagitis: Secondary | ICD-10-CM | POA: Diagnosis not present

## 2023-01-18 DIAGNOSIS — M9901 Segmental and somatic dysfunction of cervical region: Secondary | ICD-10-CM | POA: Diagnosis not present

## 2023-01-18 DIAGNOSIS — M9902 Segmental and somatic dysfunction of thoracic region: Secondary | ICD-10-CM | POA: Diagnosis not present

## 2023-01-18 DIAGNOSIS — R0989 Other specified symptoms and signs involving the circulatory and respiratory systems: Secondary | ICD-10-CM | POA: Diagnosis not present

## 2023-01-18 DIAGNOSIS — M5459 Other low back pain: Secondary | ICD-10-CM | POA: Diagnosis not present

## 2023-03-15 ENCOUNTER — Other Ambulatory Visit: Payer: Self-pay | Admitting: Family

## 2023-03-15 DIAGNOSIS — Z1231 Encounter for screening mammogram for malignant neoplasm of breast: Secondary | ICD-10-CM

## 2023-04-03 IMAGING — US US BREAST*L* LIMITED INC AXILLA
1 series · 2 of 2 positions shown · non-contrast
Comparison: None.

CLINICAL DATA: Focal pain in the left upper inner breast,
improving. No definite trauma. Baseline examination. Family history
of breast cancer, mother diagnosed at age 40-45.

EXAM:
DIGITAL DIAGNOSTIC BILATERAL MAMMOGRAM WITH TOMOSYNTHESIS AND CAD;
ULTRASOUND LEFT BREAST LIMITED
TECHNIQUE: Bilateral digital diagnostic mammography and breast tomosynthesis
was performed. The images were evaluated with computer-aided
detection.; Targeted ultrasound examination of the left breast was
performed.

[Series 1: us breast*left* limited inc axilla · 0.07mm/px · 2 of 2 slices shown]
[im 1/2]
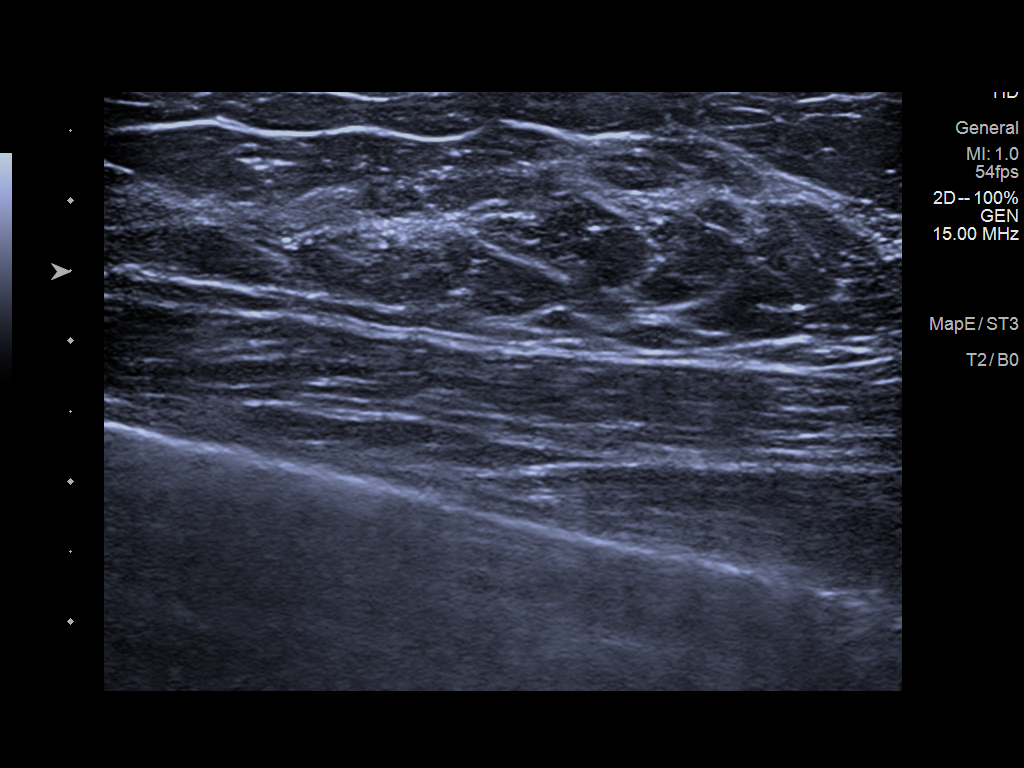
[im 2/2]
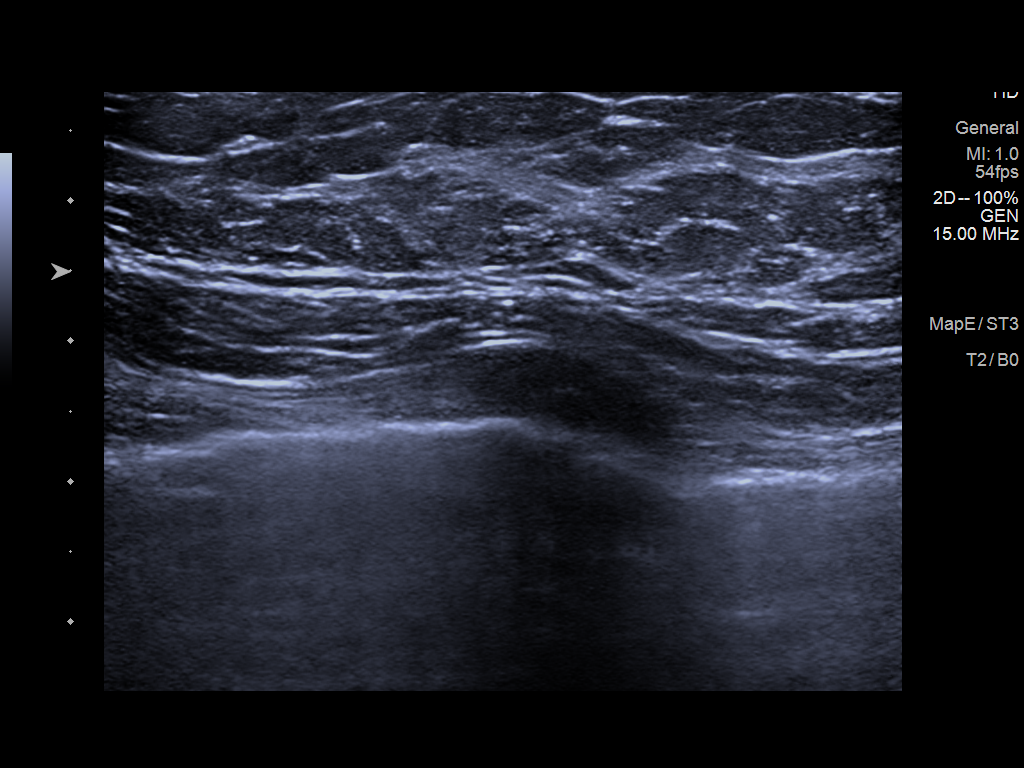

[2 of 2 positions shown; findings below may reference images not displayed]

ACR Breast Density Category c: The breast tissue is heterogeneously
dense, which may obscure small masses.
FINDINGS: A metallic BB applied to the skin at the left breast 11 o'clock
position middle depth denotes the patient indicated area of focal
pain. No underlying mammographic abnormality. No suspicious mass,
calcifications, or other findings in either breast.

On physical exam, I appreciate soft fibroglandular tissue without
discrete mass.

Targeted ultrasound of the left breast at the 11 o'clock position 4
cm from the nipple, at the patient indicated area of pain, was
performed by both the sonographer and physician. No suspicious solid
or cystic mass.
IMPRESSION: 1. No mammographic or sonographic abnormality at the patient
indicated area of pain in the left upper inner breast. Recommend any
further evaluation of this area be based on clinical assessment.
2. No mammographic findings of malignancy in either breast.

RECOMMENDATION:
Annual screening mammogram in 1 year, given patient's family history
of breast cancer.

I have discussed the findings and recommendations with the patient.
If applicable, a reminder letter will be sent to the patient
regarding the next appointment.

BI-RADS CATEGORY  1: Negative.

## 2023-04-03 IMAGING — MG DIGITAL DIAGNOSTIC BILAT W/ TOMO W/ CAD
6 of 12 series · 6 of 36 positions shown · non-contrast
Comparison: None.

CLINICAL DATA: Focal pain in the left upper inner breast,
improving. No definite trauma. Baseline examination. Family history
of breast cancer, mother diagnosed at age 40-45.

EXAM:
DIGITAL DIAGNOSTIC BILATERAL MAMMOGRAM WITH TOMOSYNTHESIS AND CAD;
ULTRASOUND LEFT BREAST LIMITED
TECHNIQUE: Bilateral digital diagnostic mammography and breast tomosynthesis
was performed. The images were evaluated with computer-aided
detection.; Targeted ultrasound examination of the left breast was
performed.

[R CC synth-2D]
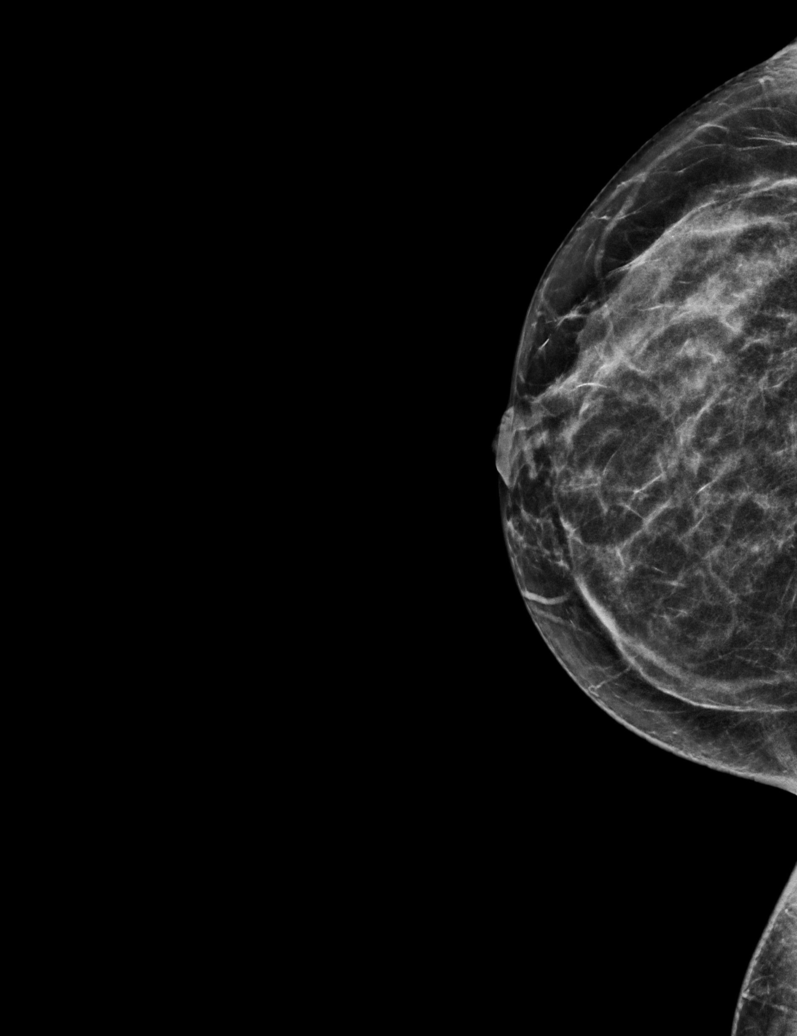

[R MLO synth-2D]
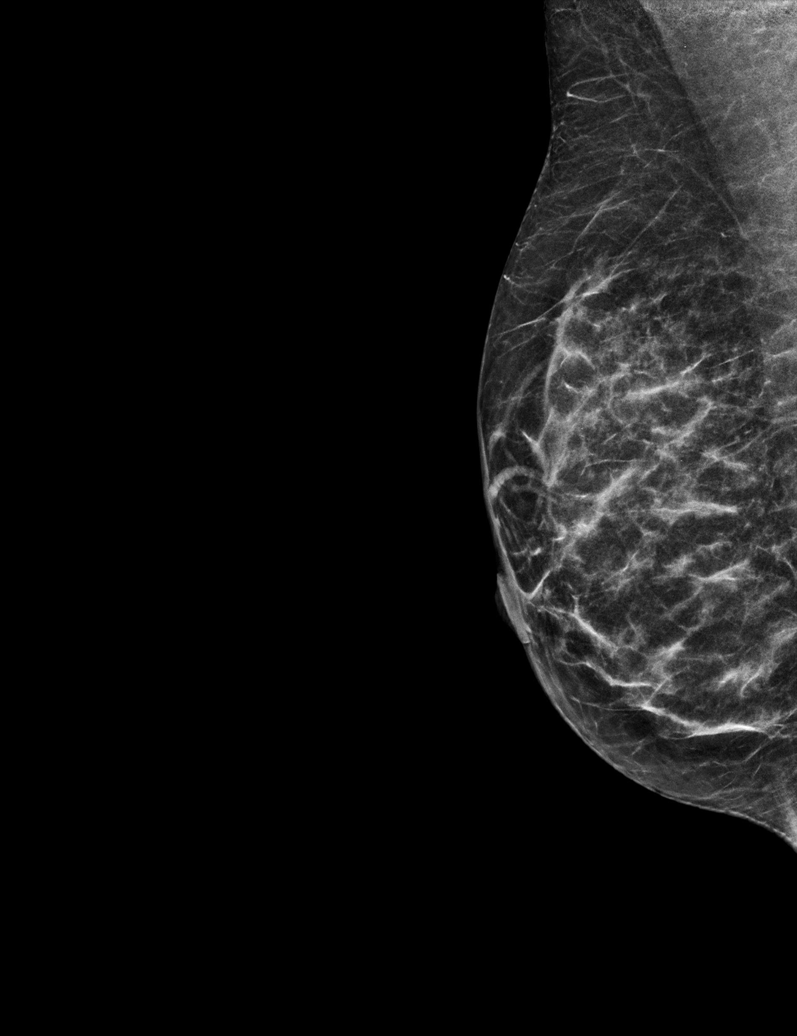

[L CC synth-2D]
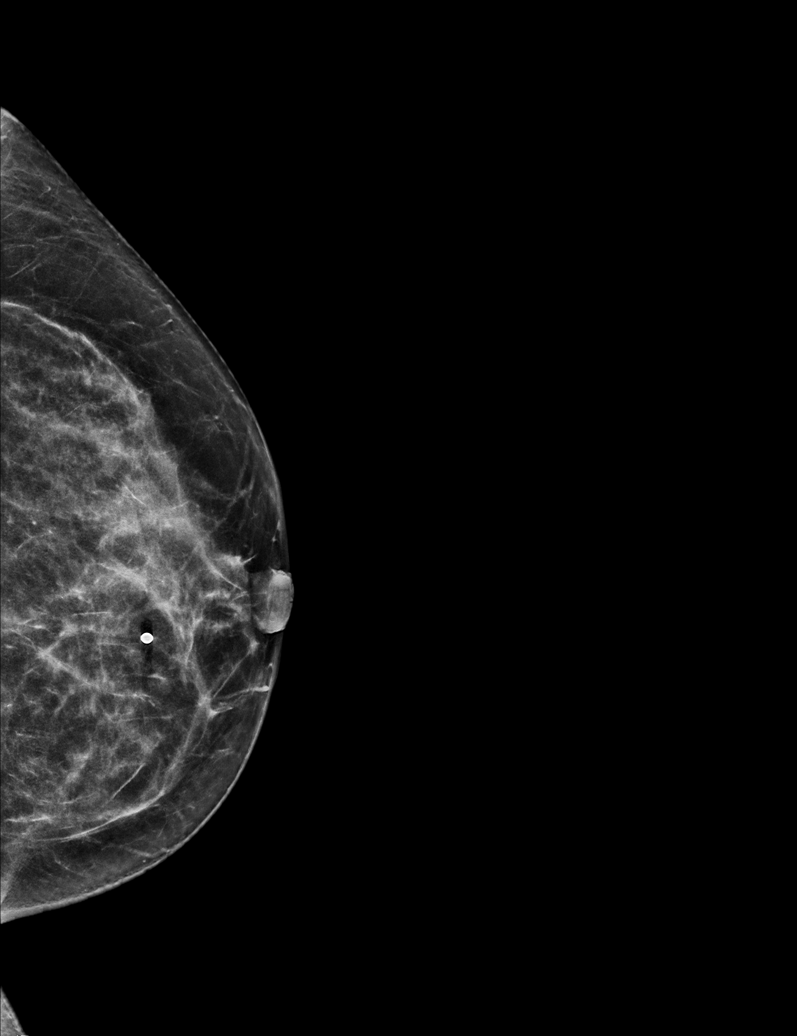

[L MLO synth-2D (1 of 2)]
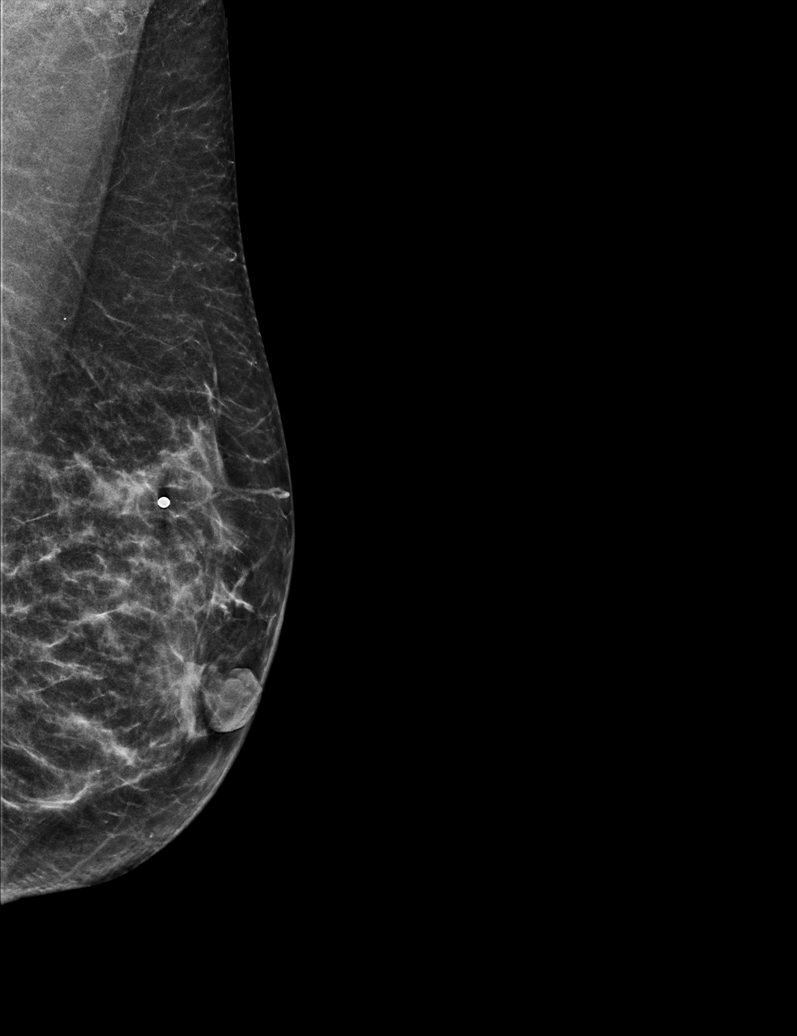

[L TAN synth-2D]
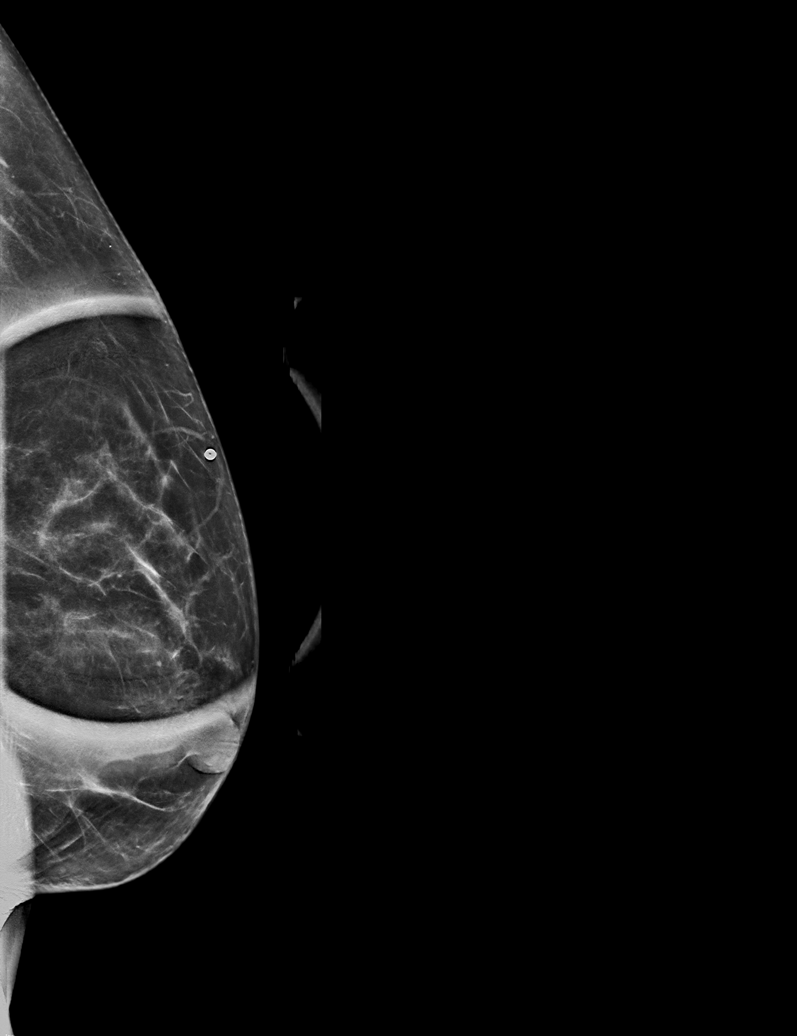

[L MLO synth-2D (2 of 2)]
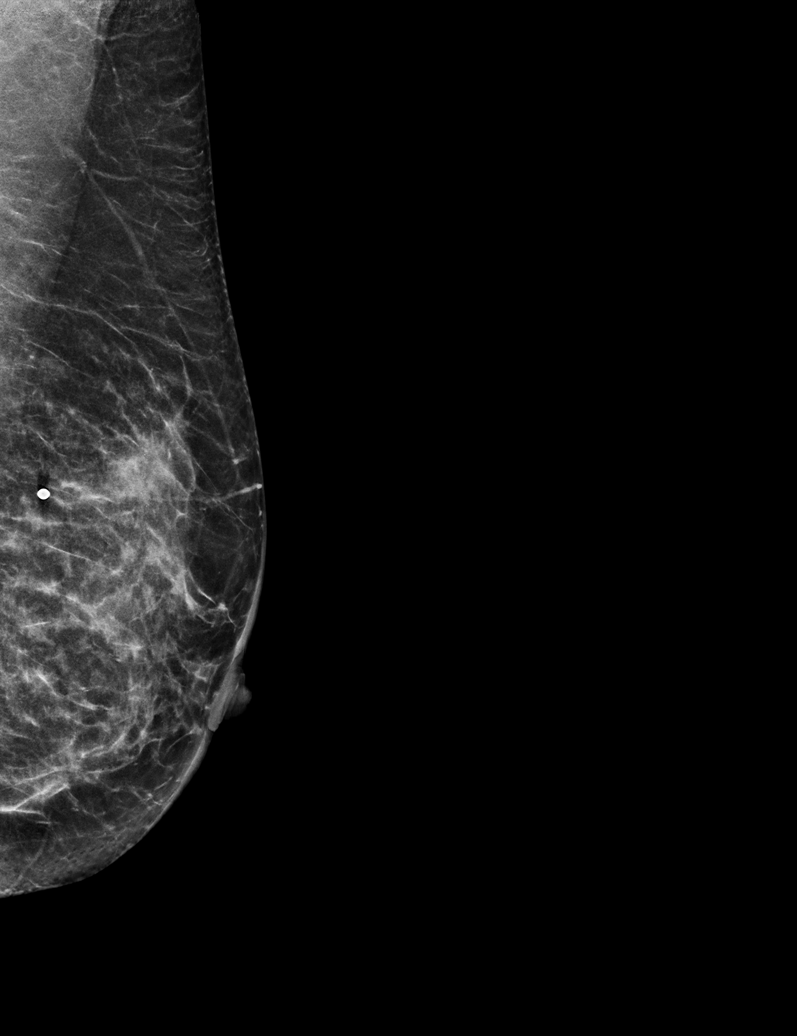

[6 of 36 positions shown; findings below may reference images not displayed]

ACR Breast Density Category c: The breast tissue is heterogeneously
dense, which may obscure small masses.
FINDINGS: A metallic BB applied to the skin at the left breast 11 o'clock
position middle depth denotes the patient indicated area of focal
pain. No underlying mammographic abnormality. No suspicious mass,
calcifications, or other findings in either breast.

On physical exam, I appreciate soft fibroglandular tissue without
discrete mass.

Targeted ultrasound of the left breast at the 11 o'clock position 4
cm from the nipple, at the patient indicated area of pain, was
performed by both the sonographer and physician. No suspicious solid
or cystic mass.
IMPRESSION: 1. No mammographic or sonographic abnormality at the patient
indicated area of pain in the left upper inner breast. Recommend any
further evaluation of this area be based on clinical assessment.
2. No mammographic findings of malignancy in either breast.

RECOMMENDATION:
Annual screening mammogram in 1 year, given patient's family history
of breast cancer.

I have discussed the findings and recommendations with the patient.
If applicable, a reminder letter will be sent to the patient
regarding the next appointment.

BI-RADS CATEGORY  1: Negative.

## 2023-04-05 DIAGNOSIS — M542 Cervicalgia: Secondary | ICD-10-CM | POA: Diagnosis not present

## 2023-04-05 DIAGNOSIS — M5459 Other low back pain: Secondary | ICD-10-CM | POA: Diagnosis not present

## 2023-04-05 DIAGNOSIS — M9903 Segmental and somatic dysfunction of lumbar region: Secondary | ICD-10-CM | POA: Diagnosis not present

## 2023-04-05 DIAGNOSIS — M9901 Segmental and somatic dysfunction of cervical region: Secondary | ICD-10-CM | POA: Diagnosis not present

## 2023-04-29 DIAGNOSIS — M5459 Other low back pain: Secondary | ICD-10-CM | POA: Diagnosis not present

## 2023-04-29 DIAGNOSIS — M9903 Segmental and somatic dysfunction of lumbar region: Secondary | ICD-10-CM | POA: Diagnosis not present

## 2023-04-29 DIAGNOSIS — M9901 Segmental and somatic dysfunction of cervical region: Secondary | ICD-10-CM | POA: Diagnosis not present

## 2023-04-29 DIAGNOSIS — M542 Cervicalgia: Secondary | ICD-10-CM | POA: Diagnosis not present

## 2023-07-28 DIAGNOSIS — N926 Irregular menstruation, unspecified: Secondary | ICD-10-CM | POA: Diagnosis not present

## 2023-07-28 DIAGNOSIS — E559 Vitamin D deficiency, unspecified: Secondary | ICD-10-CM | POA: Diagnosis not present

## 2023-07-28 DIAGNOSIS — E611 Iron deficiency: Secondary | ICD-10-CM | POA: Diagnosis not present

## 2023-07-28 DIAGNOSIS — N959 Unspecified menopausal and perimenopausal disorder: Secondary | ICD-10-CM | POA: Diagnosis not present

## 2023-07-28 DIAGNOSIS — G4700 Insomnia, unspecified: Secondary | ICD-10-CM | POA: Diagnosis not present

## 2023-07-28 DIAGNOSIS — Z1329 Encounter for screening for other suspected endocrine disorder: Secondary | ICD-10-CM | POA: Diagnosis not present

## 2023-07-28 DIAGNOSIS — Z1321 Encounter for screening for nutritional disorder: Secondary | ICD-10-CM | POA: Diagnosis not present

## 2023-07-28 DIAGNOSIS — L659 Nonscarring hair loss, unspecified: Secondary | ICD-10-CM | POA: Diagnosis not present

## 2023-07-28 DIAGNOSIS — R6882 Decreased libido: Secondary | ICD-10-CM | POA: Diagnosis not present

## 2023-07-28 DIAGNOSIS — Z131 Encounter for screening for diabetes mellitus: Secondary | ICD-10-CM | POA: Diagnosis not present

## 2023-07-28 DIAGNOSIS — E538 Deficiency of other specified B group vitamins: Secondary | ICD-10-CM | POA: Diagnosis not present

## 2023-08-05 ENCOUNTER — Other Ambulatory Visit: Payer: Self-pay | Admitting: Medical Genetics

## 2023-08-07 ENCOUNTER — Other Ambulatory Visit
Admission: RE | Admit: 2023-08-07 | Discharge: 2023-08-07 | Disposition: A | Discharge status: Home / Self Care | Payer: Self-pay | Source: Ambulatory Visit | Attending: Medical Genetics | Admitting: Medical Genetics

## 2023-08-12 DIAGNOSIS — R6882 Decreased libido: Secondary | ICD-10-CM | POA: Diagnosis not present

## 2023-08-12 DIAGNOSIS — L659 Nonscarring hair loss, unspecified: Secondary | ICD-10-CM | POA: Diagnosis not present

## 2023-08-12 DIAGNOSIS — N926 Irregular menstruation, unspecified: Secondary | ICD-10-CM | POA: Diagnosis not present

## 2023-08-12 DIAGNOSIS — G4700 Insomnia, unspecified: Secondary | ICD-10-CM | POA: Diagnosis not present

## 2023-08-20 ENCOUNTER — Ambulatory Visit
Admission: RE | Admit: 2023-08-20 | Discharge: 2023-08-20 | Disposition: A | Discharge status: Home / Self Care | Source: Ambulatory Visit | Attending: Family | Admitting: Family

## 2023-08-20 ENCOUNTER — Other Ambulatory Visit

## 2023-08-20 DIAGNOSIS — Z1231 Encounter for screening mammogram for malignant neoplasm of breast: Secondary | ICD-10-CM | POA: Diagnosis not present

## 2023-08-24 LAB — GENECONNECT MOLECULAR SCREEN: Genetic Analysis Overall Interpretation: NEGATIVE

## 2023-08-31 DIAGNOSIS — L659 Nonscarring hair loss, unspecified: Secondary | ICD-10-CM | POA: Diagnosis not present

## 2023-08-31 DIAGNOSIS — G4700 Insomnia, unspecified: Secondary | ICD-10-CM | POA: Diagnosis not present

## 2023-08-31 DIAGNOSIS — E611 Iron deficiency: Secondary | ICD-10-CM | POA: Diagnosis not present

## 2023-08-31 DIAGNOSIS — N926 Irregular menstruation, unspecified: Secondary | ICD-10-CM | POA: Diagnosis not present

## 2023-09-06 DIAGNOSIS — M542 Cervicalgia: Secondary | ICD-10-CM | POA: Diagnosis not present

## 2023-09-06 DIAGNOSIS — M5416 Radiculopathy, lumbar region: Secondary | ICD-10-CM | POA: Diagnosis not present

## 2023-09-06 DIAGNOSIS — M9901 Segmental and somatic dysfunction of cervical region: Secondary | ICD-10-CM | POA: Diagnosis not present

## 2023-09-06 DIAGNOSIS — M9903 Segmental and somatic dysfunction of lumbar region: Secondary | ICD-10-CM | POA: Diagnosis not present

## 2023-09-24 IMAGING — CT CT CARDIAC CORONARY ARTERY CALCIUM SCORE
3 series · 14 of 20 positions shown, 16 images · non-contrast
Comparison: None.
COMPARISON: None.

Addendum:
EXAM:
OVER-READ INTERPRETATION  CT CHEST

The following report is an over-read performed by radiologist Dr.
over-read does not include interpretation of cardiac or coronary
anatomy or pathology. The coronary calcium score interpretation by
the cardiologist is attached.
CLINICAL DATA: Risk stratification
Coronary Calcium Score
TECHNIQUE: The patient was scanned on a Siemens Somatom go.Top Scanner. Axial
non-contrast 3 mm slices were carried out through the heart. The
data set was analyzed on a dedicated work station and scored using
the Agatson method.

[Series 2: sa36 calcium scoring 3.00 · axial · 0.28mm/px · z∈[-1140,-1059]mm · 4 of 46 slices shown]
[im 10/46  vessel]
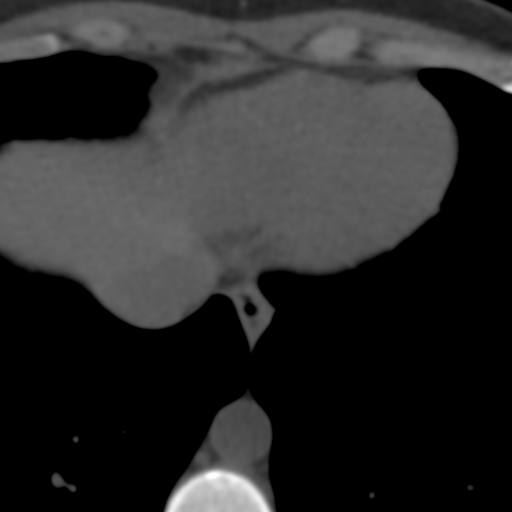
[im 19/46  vessel]
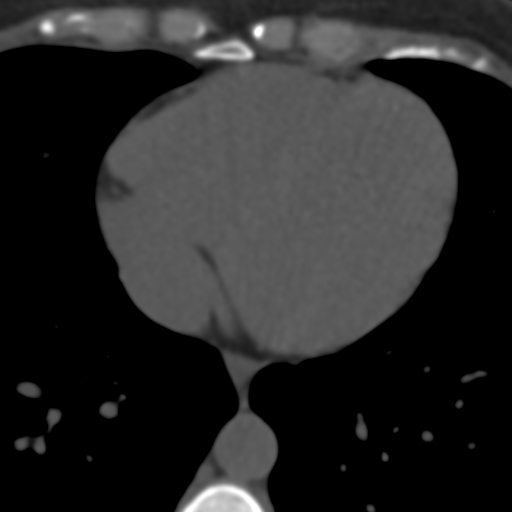
[im 28/46  vessel]
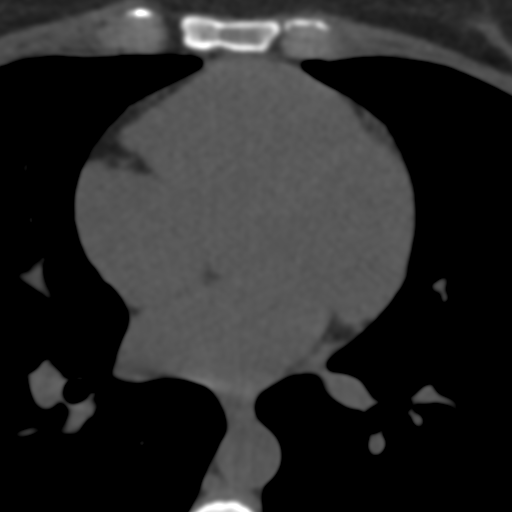
[im 37/46  vessel]
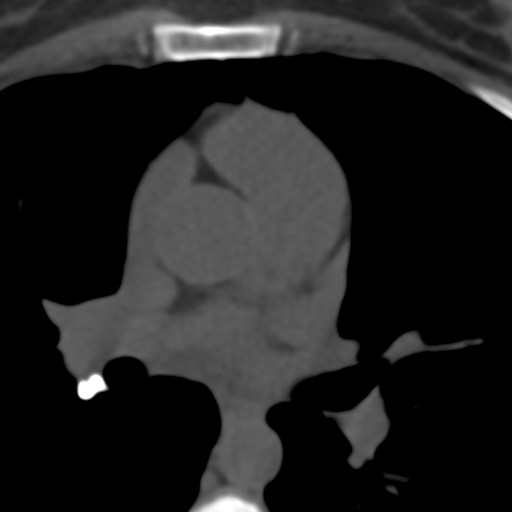

[Series 5: full fov st calcium scoring 3.00 · axial · 0.59mm/px · z∈[-1146,-1056]mm · 5 of 46 slices shown, 7 images]
[im 8/46  vessel]
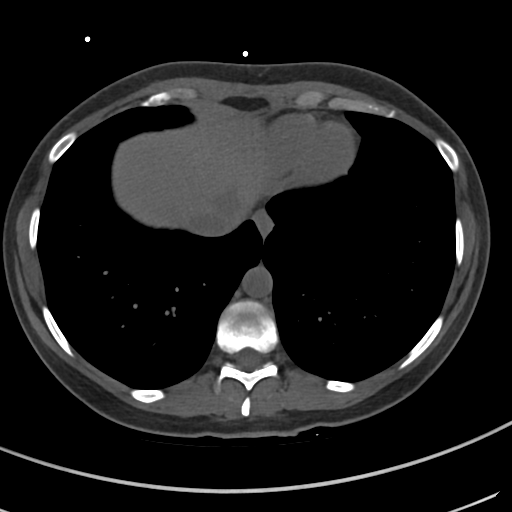
[im 8/46  lung]
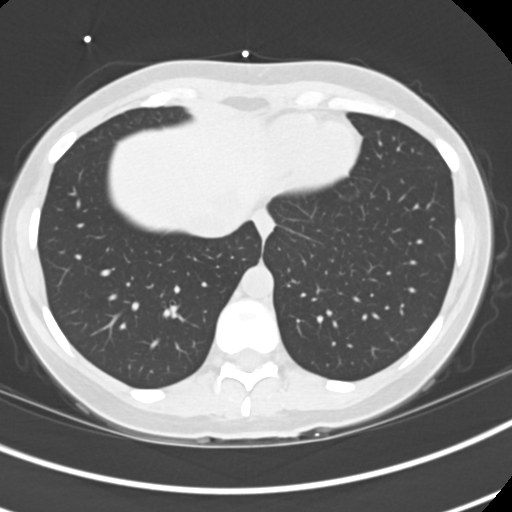
[im 16/46  vessel]
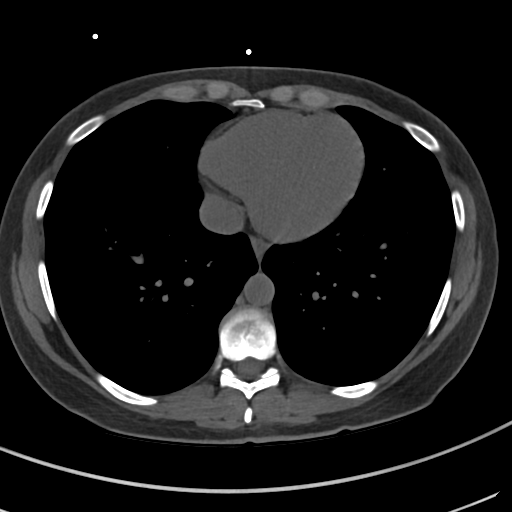
[im 23/46  vessel]
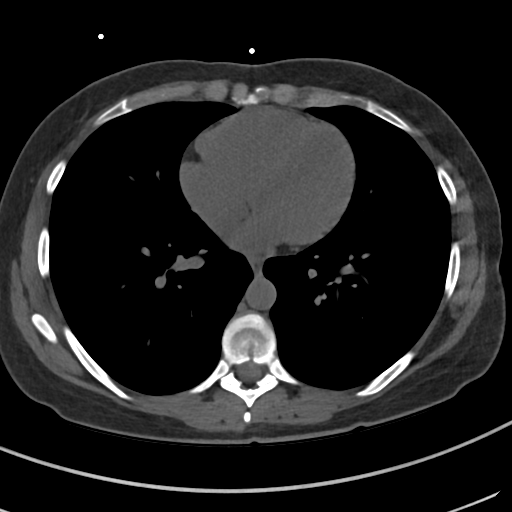
[im 31/46  vessel]
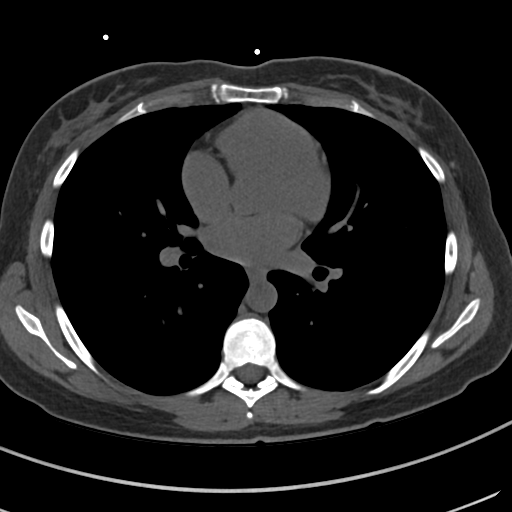
[im 38/46  vessel]
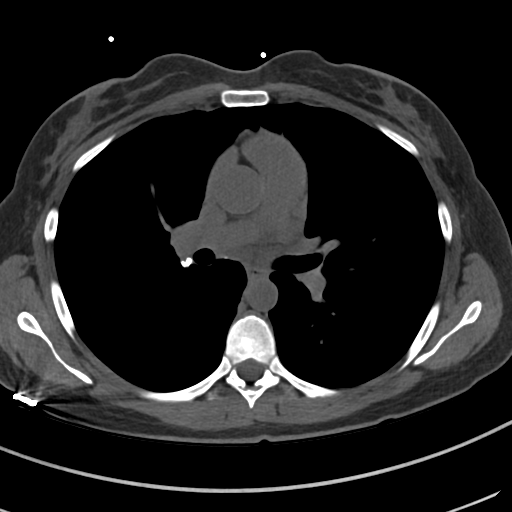
[im 38/46  lung]
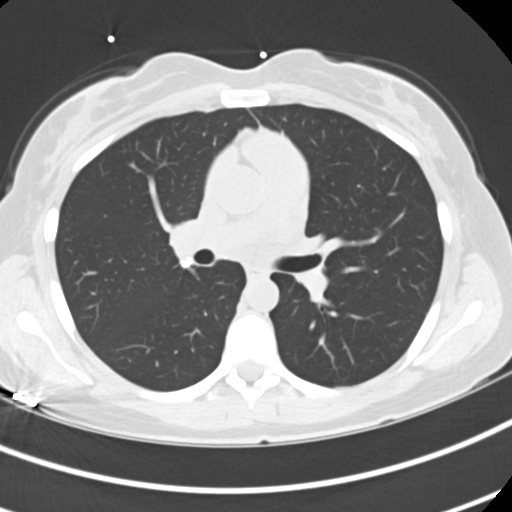

[Series 10: full fov lungs calcium scoring 3.00 ax · axial · 0.59mm/px · z∈[-1146,-1056]mm · 5 of 46 slices shown]
[im 8/46  vessel]
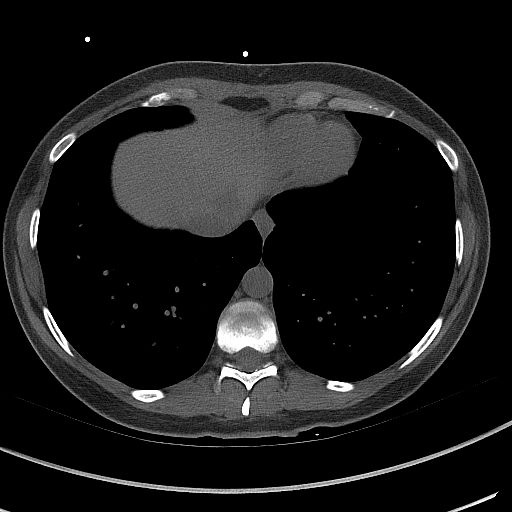
[im 16/46  vessel]
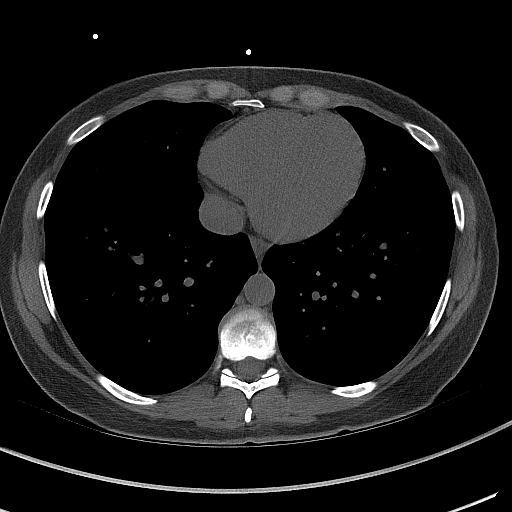
[im 23/46  vessel]
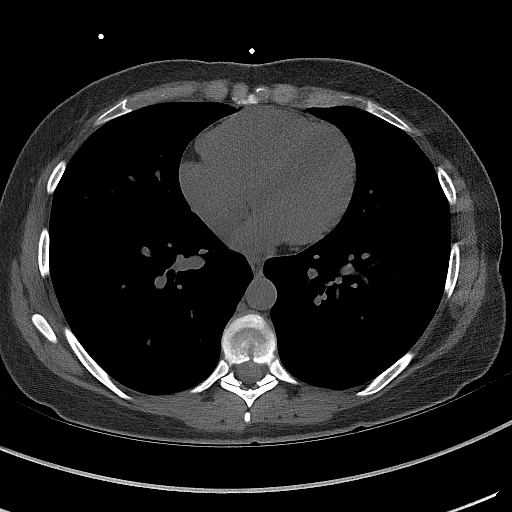
[im 31/46  vessel]
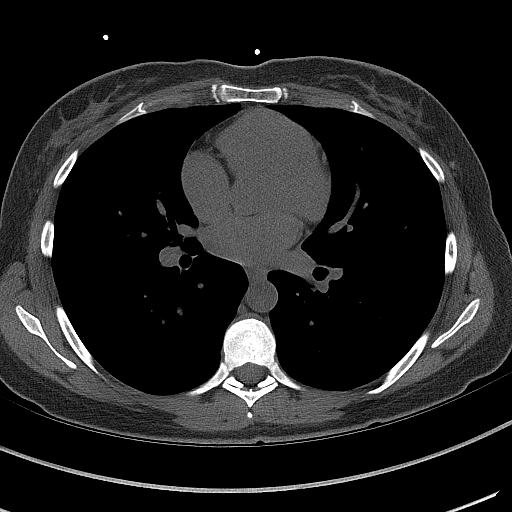
[im 38/46  vessel]
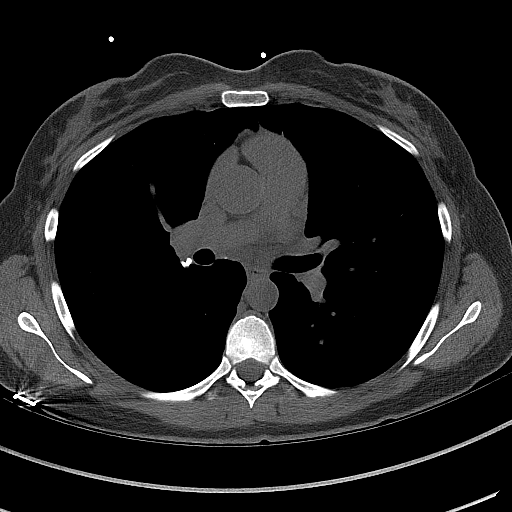

[14 of 20 positions shown; findings below may reference images not displayed]

FINDINGS: Vascular: No significant noncardiac vascular findings.

Mediastinum/Nodes: Calcified right hilar and subcarinal lymph nodes
are consistent with prior granulomatous disease.

Lungs/Pleura: Calcified granuloma of the right lower lobe.
Visualized lungs show no evidence of pulmonary edema, consolidation,
pneumothorax or pleural fluid.

Upper Abdomen: No acute abnormality.

Musculoskeletal: No chest wall mass or suspicious bone lesions
identified.
IMPRESSION: Evidence of prior granulomatous disease with calcified granuloma in
the right lower lobe and calcified right hilar and subcarinal lymph
nodes.
FINDINGS: Non-cardiac: See separate report from [REDACTED].

Ascending Aorta: Normal size

Pericardium: Normal

Coronary arteries: Normal origin of left and right coronary
arteries. Distribution of arterial calcifications if present, as
noted below;

LM 0

LAD 0

LCx 0

RCA 0

Total 0

IMPRESSION AND RECOMMENDATION:
1. Normal coronary calcium score of 0. Patient is low risk for
coronary events.

2.  CAC 0, BZHAR BRAVE0

3.  Continue heart healthy lifestyle and risk factor modification.

Kerymar Piwinski

*** End of Addendum ***
EXAM:
OVER-READ INTERPRETATION  CT CHEST

The following report is an over-read performed by radiologist Dr.
over-read does not include interpretation of cardiac or coronary
anatomy or pathology. The coronary calcium score interpretation by
the cardiologist is attached.
FINDINGS: Vascular: No significant noncardiac vascular findings.

Mediastinum/Nodes: Calcified right hilar and subcarinal lymph nodes
are consistent with prior granulomatous disease.

Lungs/Pleura: Calcified granuloma of the right lower lobe.
Visualized lungs show no evidence of pulmonary edema, consolidation,
pneumothorax or pleural fluid.

Upper Abdomen: No acute abnormality.

Musculoskeletal: No chest wall mass or suspicious bone lesions
identified.
IMPRESSION: Evidence of prior granulomatous disease with calcified granuloma in
the right lower lobe and calcified right hilar and subcarinal lymph
nodes.

## 2023-11-12 DIAGNOSIS — M542 Cervicalgia: Secondary | ICD-10-CM | POA: Diagnosis not present

## 2023-11-12 DIAGNOSIS — M9901 Segmental and somatic dysfunction of cervical region: Secondary | ICD-10-CM | POA: Diagnosis not present

## 2023-11-12 DIAGNOSIS — M5416 Radiculopathy, lumbar region: Secondary | ICD-10-CM | POA: Diagnosis not present

## 2023-11-12 DIAGNOSIS — M9903 Segmental and somatic dysfunction of lumbar region: Secondary | ICD-10-CM | POA: Diagnosis not present

## 2023-11-29 DIAGNOSIS — E559 Vitamin D deficiency, unspecified: Secondary | ICD-10-CM | POA: Diagnosis not present

## 2023-11-29 DIAGNOSIS — N959 Unspecified menopausal and perimenopausal disorder: Secondary | ICD-10-CM | POA: Diagnosis not present

## 2023-11-29 DIAGNOSIS — R4189 Other symptoms and signs involving cognitive functions and awareness: Secondary | ICD-10-CM | POA: Diagnosis not present

## 2023-11-29 DIAGNOSIS — E611 Iron deficiency: Secondary | ICD-10-CM | POA: Diagnosis not present

## 2023-11-29 DIAGNOSIS — Z1329 Encounter for screening for other suspected endocrine disorder: Secondary | ICD-10-CM | POA: Diagnosis not present

## 2023-12-07 DIAGNOSIS — G4700 Insomnia, unspecified: Secondary | ICD-10-CM | POA: Diagnosis not present

## 2023-12-07 DIAGNOSIS — L659 Nonscarring hair loss, unspecified: Secondary | ICD-10-CM | POA: Diagnosis not present

## 2023-12-07 DIAGNOSIS — E611 Iron deficiency: Secondary | ICD-10-CM | POA: Diagnosis not present

## 2023-12-07 DIAGNOSIS — N926 Irregular menstruation, unspecified: Secondary | ICD-10-CM | POA: Diagnosis not present

## 2023-12-09 ENCOUNTER — Ambulatory Visit (INDEPENDENT_AMBULATORY_CARE_PROVIDER_SITE_OTHER)

## 2023-12-09 DIAGNOSIS — L649 Androgenic alopecia, unspecified: Secondary | ICD-10-CM

## 2023-12-09 NOTE — Patient Instructions (Addendum)

## 2023-12-09 NOTE — Progress Notes (Signed)
    Subjective   Jo Gibson is a 38 y.o. female who presents for the following: hair loss. Patient is new patient  Today patient reports: Hair loss - started a year ago, had blood work done which showed abnormal testosterone , B vitamins, iron, recently had labs drawn and they are back to normal after taking supplements and testosterone  prescription- currently using Nutrafol over the counter x 1 mth. Pt concerned hair products may have contributed to hair loss.  Review of Systems:    No other skin or systemic complaints except as noted in HPI or Assessment and Plan.  The following portions of the chart were reviewed this encounter and updated as appropriate: medications, allergies, medical history  Relevant Medical History:  Abnormal labs  Objective  (SKPE) Well appearing patient in no apparent distress; mood and affect are within normal limits. Examination was performed of the: Focused Exam of: the scalp and face   Examination notable for: - Thinning of hair on frontal and vertex of the scalp with retention of frontal hairline   No erythema, no scale, no scarring, no papules, and no pustules on exam today.   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 Condition is symptomatic and bothersome to patient. Patient is flaring and not currently at treatment goal.  - Discussed the nature of this condition that will progress over time - Labs reviewed from 08/2022 - CMP, CBC, Vitamin D , TSH wnl  Discussed prescription treatment options including no treatment, topicals, orals. Discussed topical vs oral minoxidil, oral finasteride, oral spironolactone  - patient declines all tx options and opts to continue Nutrafol and observation   Level of service outlined above   Patient  instructions (SKPI)   Procedures, orders, diagnosis for this visit:  ANDROGENETIC ALOPECIA    Androgenetic alopecia    Return to clinic: Return if symptoms worsen or fail to improve.  Jo Gibson, CMA, am acting as scribe for Lauraine JAYSON Kanaris, MD .  Documentation: I have reviewed the above documentation for accuracy and completeness, and I agree with the above.  Lauraine JAYSON Kanaris, MD

## 2024-01-03 DIAGNOSIS — M542 Cervicalgia: Secondary | ICD-10-CM | POA: Diagnosis not present

## 2024-01-03 DIAGNOSIS — M5416 Radiculopathy, lumbar region: Secondary | ICD-10-CM | POA: Diagnosis not present

## 2024-01-03 DIAGNOSIS — M9903 Segmental and somatic dysfunction of lumbar region: Secondary | ICD-10-CM | POA: Diagnosis not present

## 2024-01-03 DIAGNOSIS — M9901 Segmental and somatic dysfunction of cervical region: Secondary | ICD-10-CM | POA: Diagnosis not present

## 2024-01-18 DIAGNOSIS — M9903 Segmental and somatic dysfunction of lumbar region: Secondary | ICD-10-CM | POA: Diagnosis not present

## 2024-01-18 DIAGNOSIS — M5416 Radiculopathy, lumbar region: Secondary | ICD-10-CM | POA: Diagnosis not present

## 2024-01-18 DIAGNOSIS — M9901 Segmental and somatic dysfunction of cervical region: Secondary | ICD-10-CM | POA: Diagnosis not present

## 2024-01-18 DIAGNOSIS — M542 Cervicalgia: Secondary | ICD-10-CM | POA: Diagnosis not present
